# Patient Record
Sex: Male | Born: 1970
Health system: Southern US, Community
[De-identification: ages and names within clinical notes are randomized; demographics above are authoritative.]

## PROBLEM LIST (undated history)

## (undated) DIAGNOSIS — F419 Anxiety disorder, unspecified: Secondary | ICD-10-CM

## (undated) DIAGNOSIS — IMO0001 Reserved for inherently not codable concepts without codable children: Secondary | ICD-10-CM

## (undated) DIAGNOSIS — R7309 Other abnormal glucose: Secondary | ICD-10-CM

## (undated) DIAGNOSIS — D649 Anemia, unspecified: Secondary | ICD-10-CM

## (undated) DIAGNOSIS — I4891 Unspecified atrial fibrillation: Secondary | ICD-10-CM

## (undated) DIAGNOSIS — K219 Gastro-esophageal reflux disease without esophagitis: Secondary | ICD-10-CM

## (undated) DIAGNOSIS — T7840XA Allergy, unspecified, initial encounter: Secondary | ICD-10-CM

## (undated) DIAGNOSIS — I10 Essential (primary) hypertension: Secondary | ICD-10-CM

## (undated) DIAGNOSIS — F439 Reaction to severe stress, unspecified: Secondary | ICD-10-CM

## (undated) DIAGNOSIS — F329 Major depressive disorder, single episode, unspecified: Secondary | ICD-10-CM

## (undated) DIAGNOSIS — J309 Allergic rhinitis, unspecified: Secondary | ICD-10-CM

## (undated) DIAGNOSIS — E785 Hyperlipidemia, unspecified: Secondary | ICD-10-CM

## (undated) DIAGNOSIS — F32A Depression, unspecified: Secondary | ICD-10-CM

## (undated) DIAGNOSIS — M1990 Unspecified osteoarthritis, unspecified site: Secondary | ICD-10-CM

## (undated) HISTORY — DX: Allergy, unspecified, initial encounter: T78.40XA

## (undated) HISTORY — DX: Anemia, unspecified: D64.9

## (undated) HISTORY — PX: ELBOW SURGERY: SHX618

## (undated) HISTORY — DX: Hyperlipidemia, unspecified: E78.5

## (undated) HISTORY — DX: Anxiety disorder, unspecified: F41.9

## (undated) HISTORY — PX: HERNIA REPAIR: SHX51

## (undated) HISTORY — PX: TRIGGER FINGER RELEASE: SHX641

## (undated) HISTORY — DX: Reaction to severe stress, unspecified: F43.9

## (undated) HISTORY — DX: Depression, unspecified: F32.A

## (undated) HISTORY — PX: CARPAL TUNNEL RELEASE: SHX101

## (undated) HISTORY — DX: Unspecified osteoarthritis, unspecified site: M19.90

## (undated) HISTORY — DX: Gastro-esophageal reflux disease without esophagitis: K21.9

## (undated) HISTORY — DX: Allergic rhinitis, unspecified: J30.9

## (undated) HISTORY — DX: Major depressive disorder, single episode, unspecified: F32.9

## (undated) HISTORY — DX: Other abnormal glucose: R73.09

## (undated) HISTORY — DX: Unspecified atrial fibrillation: I48.91

## (undated) HISTORY — DX: Reserved for inherently not codable concepts without codable children: IMO0001

## (undated) HISTORY — DX: Essential (primary) hypertension: I10

---

## 2001-03-20 ENCOUNTER — Encounter: Payer: Self-pay | Admitting: *Deleted

## 2001-03-20 ENCOUNTER — Emergency Department (HOSPITAL_COMMUNITY): Admission: EM | Admit: 2001-03-20 | Discharge: 2001-03-20 | Payer: Self-pay | Admitting: *Deleted

## 2001-05-26 ENCOUNTER — Ambulatory Visit (HOSPITAL_COMMUNITY): Admission: RE | Admit: 2001-05-26 | Discharge: 2001-05-26 | Payer: Self-pay | Admitting: General Surgery

## 2001-10-22 ENCOUNTER — Encounter: Payer: Self-pay | Admitting: Family Medicine

## 2001-10-22 ENCOUNTER — Ambulatory Visit (HOSPITAL_COMMUNITY): Admission: RE | Admit: 2001-10-22 | Discharge: 2001-10-22 | Payer: Self-pay | Admitting: Family Medicine

## 2003-07-21 ENCOUNTER — Other Ambulatory Visit: Admission: RE | Admit: 2003-07-21 | Discharge: 2003-07-21 | Payer: Self-pay | Admitting: Urology

## 2010-03-24 ENCOUNTER — Emergency Department (HOSPITAL_COMMUNITY): Admission: EM | Admit: 2010-03-24 | Discharge: 2010-03-25 | Payer: Self-pay | Admitting: Emergency Medicine

## 2010-12-01 LAB — CBC
HCT: 43 % (ref 39.0–52.0)
Hemoglobin: 14.9 g/dL (ref 13.0–17.0)
MCH: 32 pg (ref 26.0–34.0)
MCHC: 34.7 g/dL (ref 30.0–36.0)
MCV: 92.2 fL (ref 78.0–100.0)
Platelets: 175 10*3/uL (ref 150–400)
RBC: 4.66 MIL/uL (ref 4.22–5.81)
RDW: 14.3 % (ref 11.5–15.5)
WBC: 6 10*3/uL (ref 4.0–10.5)

## 2010-12-01 LAB — BASIC METABOLIC PANEL
BUN: 14 mg/dL (ref 6–23)
CO2: 27 mEq/L (ref 19–32)
Calcium: 9.2 mg/dL (ref 8.4–10.5)
Chloride: 104 mEq/L (ref 96–112)
Creatinine, Ser: 1.26 mg/dL (ref 0.4–1.5)
GFR calc Af Amer: >60 mL/min (ref >60)
GFR calc non Af Amer: >60 mL/min (ref >60)
Glucose, Bld: 113 mg/dL — ABNORMAL HIGH (ref 70–99)
Potassium: 4.2 mEq/L (ref 3.5–5.1)
Sodium: 138 mEq/L (ref 135–145)

## 2010-12-01 LAB — DIFFERENTIAL
Basophils Absolute: 0 10*3/uL (ref 0.0–0.1)
Basophils Relative: 0 % (ref 0–1)
Eosinophils Relative: 3 % (ref 0–5)
Monocytes Absolute: 0.4 10*3/uL (ref 0.1–1.0)

## 2010-12-01 LAB — URINALYSIS, ROUTINE W REFLEX MICROSCOPIC
Bilirubin Urine: NEGATIVE
Ketones, ur: NEGATIVE mg/dL
Nitrite: NEGATIVE
Urobilinogen, UA: 0.2 mg/dL (ref 0.0–1.0)

## 2012-12-02 ENCOUNTER — Other Ambulatory Visit: Payer: Self-pay | Admitting: *Deleted

## 2012-12-02 MED ORDER — OMEPRAZOLE 20 MG PO CPDR: 20.0000 mg | DELAYED_RELEASE_CAPSULE | Freq: Two times a day (BID) | ORAL | Status: DC

## 2012-12-02 MED ORDER — OMEPRAZOLE 20 MG PO CPDR: 20.0000 mg | DELAYED_RELEASE_CAPSULE | Freq: Every day | ORAL | Status: DC

## 2013-01-11 ENCOUNTER — Other Ambulatory Visit: Payer: Self-pay

## 2013-01-11 MED ORDER — DILTIAZEM HCL ER COATED BEADS 240 MG PO CP24: 240.0000 mg | ORAL_CAPSULE | Freq: Every day | ORAL | Status: DC

## 2013-01-12 ENCOUNTER — Encounter: Payer: Self-pay | Admitting: *Deleted

## 2013-03-22 ENCOUNTER — Other Ambulatory Visit: Payer: Self-pay | Admitting: Family Medicine

## 2013-04-22 ENCOUNTER — Other Ambulatory Visit: Payer: Self-pay | Admitting: Family Medicine

## 2013-06-22 ENCOUNTER — Other Ambulatory Visit: Payer: Self-pay | Admitting: Family Medicine

## 2013-07-05 ENCOUNTER — Ambulatory Visit (INDEPENDENT_AMBULATORY_CARE_PROVIDER_SITE_OTHER): Payer: Managed Care, Other (non HMO) | Admitting: Family Medicine

## 2013-07-05 ENCOUNTER — Encounter: Payer: Self-pay | Admitting: Family Medicine

## 2013-07-05 VITALS — BP 158/96 | Ht 68.0 in | Wt 198.0 lb

## 2013-07-05 DIAGNOSIS — M7712 Lateral epicondylitis, left elbow: Secondary | ICD-10-CM

## 2013-07-05 DIAGNOSIS — E781 Pure hyperglyceridemia: Secondary | ICD-10-CM

## 2013-07-05 DIAGNOSIS — K219 Gastro-esophageal reflux disease without esophagitis: Secondary | ICD-10-CM

## 2013-07-05 DIAGNOSIS — M7710 Lateral epicondylitis, unspecified elbow: Secondary | ICD-10-CM

## 2013-07-05 DIAGNOSIS — I10 Essential (primary) hypertension: Secondary | ICD-10-CM

## 2013-07-05 NOTE — Progress Notes (Signed)
  Subjective:    Patient ID: William Roman, male    DOB: 1970-12-16, 42 y.o.   MRN: [id number]  HPI Patient arrives with complaint of left arm pain near the elbow for about a month.pain for about a month. Cutting steel and squatted on elbow. Pain next day, still hurting. Tried local biofreeze, rotating ibuprofen aleave. Wore elbow and forearm strap.   Patient also states he has been having problems with his right little finger locking up on him and he has to forcefully straighten finger back out.  Little finger locks up, going on a couple months, use right hand a lot.  Patient states reflux overall is stable. When taking medicines.  Recently had blood work done. Reports that his triglycerides were high. Tries to watch his diet but perhaps not the best.  Claims compliance with blood pressure medicine. Trying to watch his salt intake. Walking some but not a lot.   Review of Systems No chest pain no headache no back pain no vomiting no change in bowel habits ROS otherwise negative    Objective:   Physical Exam  Alert HEENT normal. Vital stable. Blood pressure improved on repeat 140 rate he 2. Lungs clear. Heart regular in rhythm. Lateral elbow tenderness to palpation. Abdomen benign hands good range of motion no deformities      Assessment & Plan:  Impression 1 hypertension good control. #2 hyperlipidemia discussed particularly high triglycerides. #3 reflux stable. #4 lateral epicondylitis discussed #5 trigger finger discussed plan anti-inflammatory medication when necessary. Maintain other meds. Get the blood work back to [place] for review. Exercise diet discussed in encourage. WSL

## 2013-07-07 ENCOUNTER — Telehealth: Payer: Self-pay | Admitting: Family Medicine

## 2013-07-07 NOTE — Telephone Encounter (Signed)
Patient dropped off Blood Work Results from his Associate Professor.  (These are in a sealed envelope on front of chart placed in Dr. Soyla Dryer In [place] on Beth's desk).  States to call if he needs to repeat any lab draws for our office.

## 2013-07-30 ENCOUNTER — Other Ambulatory Visit: Payer: Self-pay | Admitting: Family Medicine

## 2013-08-11 ENCOUNTER — Other Ambulatory Visit: Payer: Self-pay | Admitting: Family Medicine

## 2013-08-15 DIAGNOSIS — K219 Gastro-esophageal reflux disease without esophagitis: Secondary | ICD-10-CM | POA: Insufficient documentation

## 2013-08-15 DIAGNOSIS — I10 Essential (primary) hypertension: Secondary | ICD-10-CM | POA: Insufficient documentation

## 2013-08-15 DIAGNOSIS — E781 Pure hyperglyceridemia: Secondary | ICD-10-CM | POA: Insufficient documentation

## 2013-09-11 ENCOUNTER — Other Ambulatory Visit: Payer: Self-pay | Admitting: Family Medicine

## 2013-10-28 ENCOUNTER — Other Ambulatory Visit: Payer: Self-pay | Admitting: Family Medicine

## 2013-12-24 ENCOUNTER — Other Ambulatory Visit: Payer: Self-pay | Admitting: Family Medicine

## 2014-01-26 ENCOUNTER — Other Ambulatory Visit: Payer: Self-pay | Admitting: Family Medicine

## 2014-02-23 ENCOUNTER — Other Ambulatory Visit: Payer: Self-pay | Admitting: Family Medicine

## 2014-03-09 ENCOUNTER — Other Ambulatory Visit: Payer: Self-pay | Admitting: Family Medicine

## 2014-03-23 ENCOUNTER — Other Ambulatory Visit: Payer: Self-pay | Admitting: Family Medicine

## 2014-04-12 DIAGNOSIS — Z0289 Encounter for other administrative examinations: Secondary | ICD-10-CM

## 2014-04-18 ENCOUNTER — Ambulatory Visit (INDEPENDENT_AMBULATORY_CARE_PROVIDER_SITE_OTHER): Payer: BC Managed Care – PPO | Admitting: Family Medicine

## 2014-04-18 ENCOUNTER — Encounter: Payer: Self-pay | Admitting: Family Medicine

## 2014-04-18 VITALS — BP 142/98 | Ht 69.0 in | Wt 201.0 lb

## 2014-04-18 DIAGNOSIS — Z79899 Other long term (current) drug therapy: Secondary | ICD-10-CM

## 2014-04-18 DIAGNOSIS — I10 Essential (primary) hypertension: Secondary | ICD-10-CM

## 2014-04-18 DIAGNOSIS — R7301 Impaired fasting glucose: Secondary | ICD-10-CM

## 2014-04-18 DIAGNOSIS — K219 Gastro-esophageal reflux disease without esophagitis: Secondary | ICD-10-CM

## 2014-04-18 DIAGNOSIS — E781 Pure hyperglyceridemia: Secondary | ICD-10-CM

## 2014-04-18 LAB — HEPATIC FUNCTION PANEL
ALBUMIN: 4.7 g/dL (ref 3.5–5.2)
ALK PHOS: 55 U/L (ref 39–117)
ALT: 54 U/L — ABNORMAL HIGH (ref 0–53)
AST: 32 U/L (ref 0–37)
BILIRUBIN DIRECT: 0.1 mg/dL (ref 0.0–0.3)
BILIRUBIN TOTAL: 0.5 mg/dL (ref 0.2–1.2)
Indirect Bilirubin: 0.4 mg/dL (ref 0.2–1.2)
Total Protein: 7.3 g/dL (ref 6.0–8.3)

## 2014-04-18 LAB — BASIC METABOLIC PANEL
BUN: 18 mg/dL (ref 6–23)
CHLORIDE: 104 meq/L (ref 96–112)
CO2: 28 meq/L (ref 19–32)
CREATININE: 1.27 mg/dL (ref 0.50–1.35)
Calcium: 9.5 mg/dL (ref 8.4–10.5)
GLUCOSE: 96 mg/dL (ref 70–99)
Potassium: 4.7 mEq/L (ref 3.5–5.3)
Sodium: 140 mEq/L (ref 135–145)

## 2014-04-18 LAB — LIPID PANEL
Cholesterol: 215 mg/dL — ABNORMAL HIGH (ref 0–200)
HDL: 41 mg/dL (ref >39)
LDL CALC: 142 mg/dL — AB (ref 0–99)
Total CHOL/HDL Ratio: 5.2 Ratio
Triglycerides: 160 mg/dL — ABNORMAL HIGH (ref <150)
VLDL: 32 mg/dL (ref 0–40)

## 2014-04-18 MED ORDER — DILTIAZEM HCL ER COATED BEADS 240 MG PO CP24: ORAL_CAPSULE | ORAL | Status: DC

## 2014-04-18 MED ORDER — OMEPRAZOLE 20 MG PO CPDR: DELAYED_RELEASE_CAPSULE | ORAL | Status: DC

## 2014-04-18 NOTE — Progress Notes (Signed)
   Subjective:    Patient ID: William Roman, male    DOB: 05-27-1971, 43 y.o.   MRN: [id number]  Hypertension This is a chronic problem. The current episode started more than 1 year ago. There are no compliance problems.      Exercise so so  Does not miss bp meds, increased stress last few days and not surprising numbers were up  Numbers generally good when ck'ed wlsewhere  Reflux generally stable, compliant with medications. When he stops acid blocker it returns quickly.  History of hyperlipidemia. Watching his diet better. But not completely. Not exercising.  History of glucose intolerance. Has cut down sugars in the diet.   Diet overall better thsn before  Has cut down fried foods, No bw for two yrs,    Review of Systems No chest pain no headache no back pain no abdominal pain no change in bowel habits no blood in stool ROS otherwise negative    Objective:   Physical Exam  Alert no apparent distress. Lungs clear. Heart rare rhythm H&T normal neck supple. Ankles without edema. Abdomen no tenderness no rebound no guarding no masses     Assessment & Plan:  Pressure 1 hypertension good control. #2 reflux discussed good control. #3 hyperlipidemia status uncertain. #4 impaired fasting glucose status uncertain plan meds refilled. Diet discussed exercise discussed. Appropriate blood work. Further recommendations based results. WSL

## 2014-05-01 NOTE — Progress Notes (Signed)
Patient notified and verbalized understanding of test results. No further questions.  

## 2014-06-07 LAB — PULMONARY FUNCTION TEST

## 2014-06-12 ENCOUNTER — Other Ambulatory Visit: Payer: Self-pay | Admitting: Family Medicine

## 2014-08-25 ENCOUNTER — Encounter: Payer: Self-pay | Admitting: Family Medicine

## 2014-08-25 ENCOUNTER — Ambulatory Visit (INDEPENDENT_AMBULATORY_CARE_PROVIDER_SITE_OTHER): Payer: BC Managed Care – PPO | Admitting: Family Medicine

## 2014-08-25 VITALS — BP 124/90 | Temp 98.1°F | Ht 69.0 in | Wt 201.1 lb

## 2014-08-25 DIAGNOSIS — J019 Acute sinusitis, unspecified: Secondary | ICD-10-CM

## 2014-08-25 DIAGNOSIS — B9689 Other specified bacterial agents as the cause of diseases classified elsewhere: Secondary | ICD-10-CM

## 2014-08-25 MED ORDER — AMOXICILLIN-POT CLAVULANATE 875-125 MG PO TABS: 1.0000 | ORAL_TABLET | Freq: Two times a day (BID) | ORAL | Status: DC

## 2014-08-25 NOTE — Patient Instructions (Signed)

## 2014-08-25 NOTE — Progress Notes (Signed)
   Subjective:    Patient ID: William Roman, male    DOB: 1971-05-25, 43 y.o.   MRN: [id number]  Sinusitis This is a new problem. The current episode started 1 to 4 weeks ago. The problem is unchanged. There has been no fever. The pain is moderate. Associated symptoms include congestion, coughing and sinus pressure. Past treatments include oral decongestants and acetaminophen (cough medication). The treatment provided moderate relief.   Patient states that he has no other concerns at this time.   Start off with cold started get better after week they've it got a lot worse with sinus pressure pain discomfort no wheezing or difficulty breathing no high fevers Review of Systems  HENT: Positive for congestion and sinus pressure.   Respiratory: Positive for cough.        Objective:   Physical Exam  Moderate sinus tenderness throat normal neck supple lungs clear heart regular      Assessment & Plan:  Viral syndrome Secondary sinusitis Antibiotics prescribed Follow-up of ongoing troubles

## 2014-08-28 ENCOUNTER — Telehealth: Payer: Self-pay | Admitting: Family Medicine

## 2014-08-28 MED ORDER — CEFDINIR 300 MG PO CAPS: 300.0000 mg | ORAL_CAPSULE | Freq: Two times a day (BID) | ORAL | Status: DC

## 2014-08-28 NOTE — Telephone Encounter (Signed)
Was given Augmentin

## 2014-08-28 NOTE — Telephone Encounter (Signed)
Medication sent to pharmacy. Patient was notified.  

## 2014-08-28 NOTE — Telephone Encounter (Signed)
Patient is still stopped up and having pressure.  He still has yellowish green mucous and doesn't feel like the amoxicillan is helping at all. Can we change his antibiotic?   [facility]

## 2014-08-28 NOTE — Telephone Encounter (Signed)
omnicef 300 bid ten d 

## 2014-10-04 ENCOUNTER — Other Ambulatory Visit: Payer: Self-pay | Admitting: Family Medicine

## 2014-12-28 ENCOUNTER — Other Ambulatory Visit: Payer: Self-pay | Admitting: Family Medicine

## 2015-02-02 ENCOUNTER — Other Ambulatory Visit: Payer: Self-pay | Admitting: Family Medicine

## 2015-02-02 ENCOUNTER — Other Ambulatory Visit: Payer: Self-pay | Admitting: *Deleted

## 2015-02-02 MED ORDER — DILTIAZEM HCL ER COATED BEADS 240 MG PO CP24: 240.0000 mg | ORAL_CAPSULE | Freq: Every day | ORAL | Status: DC

## 2015-02-26 ENCOUNTER — Ambulatory Visit: Payer: Self-pay | Admitting: Family Medicine

## 2015-03-01 ENCOUNTER — Ambulatory Visit (INDEPENDENT_AMBULATORY_CARE_PROVIDER_SITE_OTHER): Payer: BLUE CROSS/BLUE SHIELD | Admitting: Family Medicine

## 2015-03-01 ENCOUNTER — Encounter: Payer: Self-pay | Admitting: Family Medicine

## 2015-03-01 VITALS — BP 138/88 | Ht 69.0 in | Wt 197.4 lb

## 2015-03-01 DIAGNOSIS — E785 Hyperlipidemia, unspecified: Secondary | ICD-10-CM

## 2015-03-01 DIAGNOSIS — R7301 Impaired fasting glucose: Secondary | ICD-10-CM | POA: Diagnosis not present

## 2015-03-01 DIAGNOSIS — I10 Essential (primary) hypertension: Secondary | ICD-10-CM | POA: Diagnosis not present

## 2015-03-01 DIAGNOSIS — R0600 Dyspnea, unspecified: Secondary | ICD-10-CM | POA: Diagnosis not present

## 2015-03-01 DIAGNOSIS — Z79899 Other long term (current) drug therapy: Secondary | ICD-10-CM

## 2015-03-01 MED ORDER — OMEPRAZOLE 20 MG PO CPDR: 20.0000 mg | DELAYED_RELEASE_CAPSULE | Freq: Every day | ORAL | Status: DC

## 2015-03-01 MED ORDER — DILTIAZEM HCL ER COATED BEADS 240 MG PO CP24: 240.0000 mg | ORAL_CAPSULE | Freq: Every day | ORAL | Status: DC

## 2015-03-01 NOTE — Progress Notes (Signed)
   Subjective:    Patient ID: [name], male    DOB: October 07, 1970, 44 y.o.   MRN: [id number]  Hypertension This is a chronic problem. The current episode started more than 1 year ago. The problem has been gradually improving since onset. Treatments tried: [place]. The current treatment provides moderate improvement. There are no compliance problems.    Patient states that he needs refills Cartia. Patient would like to discuss lab results from job. Patients states no other concerns this visit.  Out of b p meds for a week   Patient had blood work which showed some mild changes with lipids and liver function. Unfortunately these were done 8 or 9 months ago.  Also has been told he has declining pulmonary function. He receives PFTs through his workplace. This has shown a steady decline over time with FEV1. Particularly. No history of asthma.  No major smoke exposure no major fume exposure. Next  Patient notes progressive dyspnea    Review of Systems No headache no chest pain no chronic cough no hemoptysis no abdominal pain    Objective:   Physical Exam Alert vitals stable blood pressure elevated 144/90. HEENT normal. Lungs clear. Heart regular in rhythm. Abdomen benign.       Assessment & Plan:  Impression 1 progressive dyspnea with shortness of breath with exertion. Also steady decline on PFTs discuss will workup further #2 hyperlipidemia status uncertain #3 hypertension #4 history of elevated liver enzymes plan appropriate blood work. X-ray. Pulmonary function tests further recommendations based results WSL

## 2015-03-02 LAB — LIPID PANEL
CHOL/HDL RATIO: 5.8 ratio — AB (ref 0.0–5.0)
Cholesterol, Total: 216 mg/dL — ABNORMAL HIGH (ref 100–199)
HDL: 37 mg/dL — AB (ref >39)
LDL Calculated: 141 mg/dL — ABNORMAL HIGH (ref 0–99)
TRIGLYCERIDES: 188 mg/dL — AB (ref 0–149)
VLDL CHOLESTEROL CAL: 38 mg/dL (ref 5–40)

## 2015-03-02 LAB — BASIC METABOLIC PANEL
BUN/Creatinine Ratio: 9 (ref 9–20)
BUN: 12 mg/dL (ref 6–24)
CHLORIDE: 103 mmol/L (ref 97–108)
CO2: 24 mmol/L (ref 18–29)
Calcium: 9.9 mg/dL (ref 8.7–10.2)
Creatinine, Ser: 1.31 mg/dL — ABNORMAL HIGH (ref 0.76–1.27)
GFR calc non Af Amer: 67 mL/min/{1.73_m2} (ref >59)
GFR, EST AFRICAN AMERICAN: 78 mL/min/{1.73_m2} (ref >59)
GLUCOSE: 101 mg/dL — AB (ref 65–99)
Potassium: 5.6 mmol/L — ABNORMAL HIGH (ref 3.5–5.2)
Sodium: 144 mmol/L (ref 134–144)

## 2015-03-02 LAB — HEPATIC FUNCTION PANEL
ALBUMIN: 5 g/dL (ref 3.5–5.5)
ALT: 44 IU/L (ref 0–44)
AST: 28 IU/L (ref 0–40)
Alkaline Phosphatase: 76 IU/L (ref 39–117)
BILIRUBIN TOTAL: 0.5 mg/dL (ref 0.0–1.2)
Bilirubin, Direct: 0.12 mg/dL (ref 0.00–0.40)
TOTAL PROTEIN: 7.5 g/dL (ref 6.0–8.5)

## 2015-03-06 ENCOUNTER — Ambulatory Visit (HOSPITAL_COMMUNITY)
Admission: RE | Admit: 2015-03-06 | Discharge: 2015-03-06 | Disposition: A | Discharge status: Home / Self Care | Payer: BLUE CROSS/BLUE SHIELD | Source: Ambulatory Visit | Attending: Family Medicine | Admitting: Family Medicine

## 2015-03-06 DIAGNOSIS — R0602 Shortness of breath: Secondary | ICD-10-CM | POA: Insufficient documentation

## 2015-03-14 ENCOUNTER — Ambulatory Visit (HOSPITAL_COMMUNITY)
Admission: RE | Admit: 2015-03-14 | Discharge: 2015-03-14 | Disposition: A | Discharge status: Home / Self Care | Payer: BLUE CROSS/BLUE SHIELD | Source: Ambulatory Visit | Attending: Family Medicine | Admitting: Family Medicine

## 2015-03-14 DIAGNOSIS — R0600 Dyspnea, unspecified: Secondary | ICD-10-CM | POA: Diagnosis present

## 2015-03-14 LAB — PULMONARY FUNCTION TEST
DL/VA % pred: 126 %
DL/VA: 5.76 ml/min/mmHg/L
DLCO unc % pred: 106 %
DLCO unc: 31.56 ml/min/mmHg
FEF 25-75 POST: 4.78 L/s
FEF 25-75 PRE: 4.09 L/s
FEF2575-%Change-Post: 16 %
FEF2575-%PRED-POST: 128 %
FEF2575-%PRED-PRE: 110 %
FEV1-%CHANGE-POST: 3 %
FEV1-%Pred-Post: 101 %
FEV1-%Pred-Pre: 98 %
FEV1-PRE: 3.85 L
FEV1-Post: 3.99 L
FEV1FVC-%Change-Post: 4 %
FEV1FVC-%PRED-PRE: 104 %
FEV6-%Change-Post: -1 %
FEV6-%PRED-POST: 95 %
FEV6-%Pred-Pre: 96 %
FEV6-POST: 4.6 L
FEV6-PRE: 4.65 L
FEV6FVC-%PRED-PRE: 103 %
FEV6FVC-%Pred-Post: 103 %
FVC-%Change-Post: -1 %
FVC-%PRED-PRE: 94 %
FVC-%Pred-Post: 93 %
FVC-POST: 4.6 L
FVC-Pre: 4.65 L
POST FEV6/FVC RATIO: 100 %
Post FEV1/FVC ratio: 87 %
Pre FEV1/FVC ratio: 83 %
Pre FEV6/FVC Ratio: 100 %
RV % PRED: 65 %
RV: 1.15 L
TLC % pred: 87 %
TLC: 5.68 L

## 2015-03-14 MED ORDER — ALBUTEROL SULFATE (2.5 MG/3ML) 0.083% IN NEBU
2.5000 mg | INHALATION_SOLUTION | Freq: Once | RESPIRATORY_TRACT | Status: AC
  Administered 2015-03-14: 2.5 mg via RESPIRATORY_TRACT

## 2015-07-16 ENCOUNTER — Encounter: Payer: Self-pay | Admitting: Family Medicine

## 2015-07-16 ENCOUNTER — Ambulatory Visit (HOSPITAL_COMMUNITY)
Admission: RE | Admit: 2015-07-16 | Discharge: 2015-07-16 | Disposition: A | Discharge status: Home / Self Care | Payer: BLUE CROSS/BLUE SHIELD | Source: Ambulatory Visit | Attending: Family Medicine | Admitting: Family Medicine

## 2015-07-16 ENCOUNTER — Ambulatory Visit (INDEPENDENT_AMBULATORY_CARE_PROVIDER_SITE_OTHER): Payer: BLUE CROSS/BLUE SHIELD | Admitting: Family Medicine

## 2015-07-16 VITALS — BP 138/90 | Ht 69.0 in | Wt 193.8 lb

## 2015-07-16 DIAGNOSIS — M79641 Pain in right hand: Secondary | ICD-10-CM | POA: Insufficient documentation

## 2015-07-16 MED ORDER — NABUMETONE 750 MG PO TABS: 750.0000 mg | ORAL_TABLET | Freq: Two times a day (BID) | ORAL | Status: DC

## 2015-07-16 NOTE — Progress Notes (Signed)
   Subjective:    Patient ID: William Roman, male    DOB: 1971-01-29, 44 y.o.   MRN: [id number]  HPI Patient arrives with c/o right hand pain and swelling for 3 weeks- worse last week.  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 Does use his right hand a lot. 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  Developed gradual pain. Sharp in nature comes and goes worse while working now more swelling last few days recalls no sudden injury   Review of Systems Some wrist pain no elbow pain no joint pain elsewhere    Objective:   Physical Exam  Alert final stable. Lungs clear heart regular in rhythm right dorsum hand impressive swelling positive pain with deep pressure. No point tenderness risk good range of motion      Assessment & Plan:  Impression probable T no synovitis dorsal hand from overuse plan x-ray. Relafen twice a day with food expect slow resolution try to diminish use of right hand WSL

## 2015-08-08 ENCOUNTER — Telehealth: Payer: Self-pay | Admitting: Family Medicine

## 2015-08-08 MED ORDER — PREDNISONE 20 MG PO TABS: ORAL_TABLET | ORAL | Status: DC

## 2015-08-08 NOTE — Telephone Encounter (Signed)
Patient is still having a lot of pain in his wrist, but the inflammation is gone.  He was seen for this on 07/16/2015.  He is still not able to do a lot of daily activities and he wants to know if he should be seen again or if we can send in Rx for steroid?    [facility]

## 2015-08-08 NOTE — Telephone Encounter (Signed)
Left message on voicemail notifying patient med was sent to pharmacy.

## 2015-08-08 NOTE — Telephone Encounter (Signed)
Adult pred taper 

## 2015-08-22 ENCOUNTER — Other Ambulatory Visit: Payer: Self-pay | Admitting: Family Medicine

## 2015-09-15 ENCOUNTER — Other Ambulatory Visit: Payer: Self-pay | Admitting: Family Medicine

## 2015-09-27 ENCOUNTER — Other Ambulatory Visit: Payer: Self-pay | Admitting: Family Medicine

## 2015-10-08 ENCOUNTER — Ambulatory Visit (INDEPENDENT_AMBULATORY_CARE_PROVIDER_SITE_OTHER): Payer: BLUE CROSS/BLUE SHIELD | Admitting: Family Medicine

## 2015-10-08 ENCOUNTER — Encounter: Payer: Self-pay | Admitting: Family Medicine

## 2015-10-08 VITALS — BP 138/86 | Ht 69.0 in | Wt 197.5 lb

## 2015-10-08 DIAGNOSIS — M79641 Pain in right hand: Secondary | ICD-10-CM

## 2015-10-08 DIAGNOSIS — K219 Gastro-esophageal reflux disease without esophagitis: Secondary | ICD-10-CM

## 2015-10-08 DIAGNOSIS — I10 Essential (primary) hypertension: Secondary | ICD-10-CM | POA: Diagnosis not present

## 2015-10-08 DIAGNOSIS — E785 Hyperlipidemia, unspecified: Secondary | ICD-10-CM | POA: Diagnosis not present

## 2015-10-08 MED ORDER — DILTIAZEM HCL ER COATED BEADS 240 MG PO CP24: 240.0000 mg | ORAL_CAPSULE | Freq: Every day | ORAL | Status: DC

## 2015-10-08 MED ORDER — NABUMETONE 750 MG PO TABS: 750.0000 mg | ORAL_TABLET | Freq: Two times a day (BID) | ORAL | Status: DC

## 2015-10-08 MED ORDER — OMEPRAZOLE 20 MG PO CPDR: 20.0000 mg | DELAYED_RELEASE_CAPSULE | Freq: Every day | ORAL | Status: DC

## 2015-10-08 NOTE — Progress Notes (Signed)
   Subjective:    Patient ID: William Roman, male    DOB: 03-15-1971, 45 y.o.   MRN: [id number]  Hypertension This is a chronic problem. The current episode started more than 1 year ago. The problem has been gradually improving since onset. The problem is controlled. There are no associated agents to hypertension. There are no known risk factors for coronary artery disease. Treatments tried: [place]. The current treatment provides moderate improvement. There are no compliance problems.    Patient needs a refill on nabumetone. States definitely helps his wrist. Please see prior notes. Notes more swelling. Unfortunately working 60 hours week. 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 Meds reviewed today. Completely compliant. Without develops tremendous side effects  Takes qhs night itme meds of blood pressure and omeprazole  Review of Systems No headache no chest pain no abdominal pain no change in bowel habits no blood in stool    Objective:   Physical Exam Alert vitals stable. Lungs clear. Heart rare rhythm HET normal abdomen benign right dorsal wrist tenderness with flexion and extension ankles no edema       Assessment & Plan:  Impression 1 hypertension good control meds reviewed to maintain same #2 right hand wrist tendinitis discussed patient maintain anti-inflammatory medicine use and work on diminishing ergonomic load #3 reflux ongoing in nature with need for this level meds discussed and clarified plan all meds refilled. Diet exercise discussed. Blood work discussed. WSL

## 2015-10-24 ENCOUNTER — Encounter: Payer: Self-pay | Admitting: Family Medicine

## 2015-10-24 ENCOUNTER — Ambulatory Visit (INDEPENDENT_AMBULATORY_CARE_PROVIDER_SITE_OTHER): Payer: BLUE CROSS/BLUE SHIELD | Admitting: Family Medicine

## 2015-10-24 VITALS — BP 128/82 | Temp 98.5°F | Ht 69.0 in | Wt 192.6 lb

## 2015-10-24 DIAGNOSIS — J111 Influenza due to unidentified influenza virus with other respiratory manifestations: Secondary | ICD-10-CM

## 2015-10-24 NOTE — Progress Notes (Signed)
   Subjective:    Patient ID: William Roman, male    DOB: [birth year], 45 y.o.   MRN: [id number]  Cough This is a new problem. The current episode started in the past 7 days. Associated symptoms include a fever, headaches, myalgias, nasal congestion and a sore throat. Treatments tried: otc meds.   Four to five days ago started with loose stools  Sig cough started a few d ago  Got ral achey and felt bad  Ibu takes 800 mg felt very bad still  Deep bronchial cough  cheills  difuse headache back of head and behind the eyes   Energy level ok, appetite decent    Review of Systems  Constitutional: Positive for fever.  HENT: Positive for sore throat.   Respiratory: Positive for cough.   Musculoskeletal: Positive for myalgias.  Neurological: Positive for headaches.       Objective:   Physical Exam Alert mild malaise. Vitals stable. HEENT slight nasal congestion frank normal neck supple lungs clear. Heart regular in rhythm.       Assessment & Plan:  Alert mild malaise. Impression 1 probable influenza discussed plan symptom care only at this point warning signs discussed patient to call back if secondary symptoms emerge WSL

## 2015-10-30 ENCOUNTER — Telehealth: Payer: Self-pay | Admitting: Family Medicine

## 2015-10-30 MED ORDER — AMOXICILLIN-POT CLAVULANATE 875-125 MG PO TABS: ORAL_TABLET | ORAL | Status: DC

## 2015-10-30 NOTE — Telephone Encounter (Signed)
Pt called stating that his mucous has changed from clear to greenish yellow with a tint of blood. Pt was told if there was a change that the Dr. Lynnda Child call in an antibiotic for him.     [facility]

## 2015-10-30 NOTE — Telephone Encounter (Signed)
Aug bid ten d 875 mg

## 2015-10-30 NOTE — Telephone Encounter (Signed)
Spoke with patient and informed him per Dr.Steve Luking- Augmentin 875 1 tablet twice a day for 10 days was sent to [facility]. Patient verbalized understanding.

## 2015-10-30 NOTE — Telephone Encounter (Signed)
[facility] 10/30/15

## 2016-01-25 ENCOUNTER — Other Ambulatory Visit: Payer: Self-pay | Admitting: Family Medicine

## 2016-03-10 ENCOUNTER — Telehealth: Payer: Self-pay | Admitting: Family Medicine

## 2016-03-10 DIAGNOSIS — I10 Essential (primary) hypertension: Secondary | ICD-10-CM

## 2016-03-10 DIAGNOSIS — E781 Pure hyperglyceridemia: Secondary | ICD-10-CM

## 2016-03-10 NOTE — Telephone Encounter (Signed)
Pt needs bw orders for appt july 5th   Last labs  02/29/16 BMP, Hep, Lip, CBC

## 2016-03-11 NOTE — Telephone Encounter (Signed)
Left message return call (blood work ordered)

## 2016-03-11 NOTE — Telephone Encounter (Signed)
Lip liv m7 

## 2016-03-11 NOTE — Telephone Encounter (Signed)
Pt.notified

## 2016-03-16 LAB — HEPATIC FUNCTION PANEL
ALBUMIN: 4.7 g/dL (ref 3.5–5.5)
ALT: 31 IU/L (ref 0–44)
AST: 22 IU/L (ref 0–40)
Alkaline Phosphatase: 74 IU/L (ref 39–117)
Bilirubin Total: 0.8 mg/dL (ref 0.0–1.2)
Bilirubin, Direct: 0.17 mg/dL (ref 0.00–0.40)
Total Protein: 7 g/dL (ref 6.0–8.5)

## 2016-03-16 LAB — BASIC METABOLIC PANEL
BUN/Creatinine Ratio: 10 (ref 9–20)
BUN: 13 mg/dL (ref 6–24)
CALCIUM: 9.5 mg/dL (ref 8.7–10.2)
CHLORIDE: 102 mmol/L (ref 96–106)
CO2: 23 mmol/L (ref 18–29)
Creatinine, Ser: 1.29 mg/dL — ABNORMAL HIGH (ref 0.76–1.27)
GFR calc Af Amer: 79 mL/min/{1.73_m2} (ref >59)
GFR calc non Af Amer: 68 mL/min/{1.73_m2} (ref >59)
GLUCOSE: 100 mg/dL — AB (ref 65–99)
POTASSIUM: 4 mmol/L (ref 3.5–5.2)
Sodium: 142 mmol/L (ref 134–144)

## 2016-03-16 LAB — LIPID PANEL
CHOLESTEROL TOTAL: 207 mg/dL — AB (ref 100–199)
Chol/HDL Ratio: 4.6 ratio units (ref 0.0–5.0)
HDL: 45 mg/dL (ref >39)
LDL CALC: 133 mg/dL — AB (ref 0–99)
Triglycerides: 146 mg/dL (ref 0–149)
VLDL CHOLESTEROL CAL: 29 mg/dL (ref 5–40)

## 2016-03-19 ENCOUNTER — Ambulatory Visit (INDEPENDENT_AMBULATORY_CARE_PROVIDER_SITE_OTHER): Payer: BLUE CROSS/BLUE SHIELD | Admitting: Family Medicine

## 2016-03-19 ENCOUNTER — Encounter: Payer: Self-pay | Admitting: Family Medicine

## 2016-03-19 VITALS — BP 144/90 | Ht 69.0 in | Wt 181.4 lb

## 2016-03-19 DIAGNOSIS — I10 Essential (primary) hypertension: Secondary | ICD-10-CM | POA: Diagnosis not present

## 2016-03-19 DIAGNOSIS — N521 Erectile dysfunction due to diseases classified elsewhere: Secondary | ICD-10-CM | POA: Diagnosis not present

## 2016-03-19 DIAGNOSIS — M79641 Pain in right hand: Secondary | ICD-10-CM | POA: Diagnosis not present

## 2016-03-19 DIAGNOSIS — Z139 Encounter for screening, unspecified: Secondary | ICD-10-CM

## 2016-03-19 DIAGNOSIS — E785 Hyperlipidemia, unspecified: Secondary | ICD-10-CM | POA: Diagnosis not present

## 2016-03-19 DIAGNOSIS — R5383 Other fatigue: Secondary | ICD-10-CM

## 2016-03-19 MED ORDER — DILTIAZEM HCL ER COATED BEADS 240 MG PO CP24: 240.0000 mg | ORAL_CAPSULE | Freq: Every day | ORAL | Status: DC

## 2016-03-19 NOTE — Progress Notes (Signed)
   Subjective:    Patient ID: William Roman, male    DOB: [birth year], 45 y.o.   MRN: [id number]  patient arrives office with numerous concerns. Hypertension This is a chronic problem. The current episode started more than 1 year ago. The problem has been gradually improving since onset. There are no associated agents to hypertension. There are no known risk factors for coronary artery disease. Treatments tried: diltazem. The current treatment provides moderate improvement. There are no compliance problems.    Patient is still having right wrist pain that has been present for 6-7 months now.still having sig problems. cponstant pain and hjrtin gmore. Not taking a lot of ibuprofen.  Affects ability to do certain things withworth, cutting  Belts with cutters eetc.  Patient wants to discuss his lab work results  Patient is having problems with ED also. challengw with ere ctions fopver the last two years Pogressive i naure. Hisory of low testosterone.   Known history of hyperlipidema. Moty eentring to watch det  Results for orders placed or performed in visit on 03/10/16  Lipid panel  Result Value Ref Range   Cholesterol, Total 207 (H) 100 - 199 mg/dL   Triglycerides 146 0 - 149 mg/dL   HDL 45 >39 mg/dL   VLDL Cholesterol Cal 29 5 - 40 mg/dL   LDL Calculated 133 (H) 0 - 99 mg/dL   Chol/HDL Ratio 4.6 0.0 - 5.0 ratio units  Hepatic function panel  Result Value Ref Range   Total Protein 7.0 6.0 - 8.5 g/dL   Albumin 4.7 3.5 - 5.5 g/dL   Bilirubin Total 0.8 0.0 - 1.2 mg/dL   Bilirubin, Direct 0.17 0.00 - 0.40 mg/dL   Alkaline Phosphatase 74 39 - 117 IU/L   AST 22 0 - 40 IU/L   ALT 31 0 - 44 IU/L  Basic metabolic panel  Result Value Ref Range   Glucose 100 (H) 65 - 99 mg/dL   BUN 13 6 - 24 mg/dL   Creatinine, Ser 1.29 (H) 0.76 - 1.27 mg/dL   GFR calc non Af Amer 68 >59 mL/min/1.73   GFR calc Af Amer 79 >59 mL/min/1.73   BUN/Creatinine Ratio 10 9 - 20   Sodium 142 134 - 144 mmol/L   Potassium 4.0 3.5 - 5.2 mmol/L   Chloride 102 96 - 106 mmol/L   CO2 23 18 - 29 mmol/L   Calcium 9.5 8.7 - 10.2 mg/dL     Review of Systems No headache, no major weight loss or weight gain, no chest pain no back pain abdominal pain no change in bowel habits complete ROS otherwise negative     Objective:   Physical Exam    alert vitl stbleblood presure good nrepe HEENT normallungs cear heart rare rhythm right dorsal wrist handsome tenderness to deep palpation no obviosdeormity n lligcurrntly     Assessment & Plan:  Impression chronic wrist pain time fr orthopedic referraldiscusd 3 hypertenion good corol dicussd 33 herlipiedecent cntrol discussed #4 erectile dsfuntion worsening pla continue same boodpressur medication. ,  otopedic eeral discussed and recommended further blood work to [employer] [facility]. Follow-u with phyical. dietexercise dscussedWSL

## 2016-04-17 ENCOUNTER — Ambulatory Visit (INDEPENDENT_AMBULATORY_CARE_PROVIDER_SITE_OTHER): Payer: BLUE CROSS/BLUE SHIELD | Admitting: Family Medicine

## 2016-04-17 ENCOUNTER — Encounter: Payer: Self-pay | Admitting: Family Medicine

## 2016-04-17 VITALS — BP 158/104 | Ht 67.5 in | Wt 182.0 lb

## 2016-04-17 DIAGNOSIS — A609 Anogenital herpesviral infection, unspecified: Secondary | ICD-10-CM | POA: Diagnosis not present

## 2016-04-17 DIAGNOSIS — I10 Essential (primary) hypertension: Secondary | ICD-10-CM

## 2016-04-17 DIAGNOSIS — A6000 Herpesviral infection of urogenital system, unspecified: Secondary | ICD-10-CM

## 2016-04-17 DIAGNOSIS — Z0000 Encounter for general adult medical examination without abnormal findings: Secondary | ICD-10-CM

## 2016-04-17 LAB — HSV(HERPES SIMPLEX VRS) I + II AB-IGM: HSVI/II COMB AB IGM: 1.87 ratio — AB (ref 0.00–0.90)

## 2016-04-17 LAB — HSV(HERPES SIMPLEX VRS) I + II AB-IGG
HSV 1 Glycoprotein G Ab, IgG: <0.91 index (ref 0.00–0.90)
HSV 2 Glycoprotein G Ab, IgG: 11.3 index — ABNORMAL HIGH (ref 0.00–0.90)

## 2016-04-17 LAB — TESTOSTERONE: TESTOSTERONE: 477 ng/dL (ref 264–916)

## 2016-04-17 MED ORDER — ACYCLOVIR 400 MG PO TABS: 400.0000 mg | ORAL_TABLET | Freq: Two times a day (BID) | ORAL | 5 refills | Status: DC

## 2016-04-17 MED ORDER — DILTIAZEM HCL ER COATED BEADS 360 MG PO CP24: 360.0000 mg | ORAL_CAPSULE | Freq: Every day | ORAL | 5 refills | Status: DC
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
## 2016-05-15 ENCOUNTER — Other Ambulatory Visit: Payer: Self-pay | Admitting: Family Medicine

## 2016-10-13 ENCOUNTER — Other Ambulatory Visit: Payer: Self-pay | Admitting: Family Medicine

## 2016-12-13 ENCOUNTER — Other Ambulatory Visit: Payer: Self-pay | Admitting: Family Medicine

## 2016-12-15 NOTE — Telephone Encounter (Signed)
Last seen 04/17/16

## 2017-02-02 ENCOUNTER — Emergency Department (HOSPITAL_COMMUNITY): Payer: BLUE CROSS/BLUE SHIELD

## 2017-02-02 ENCOUNTER — Encounter (HOSPITAL_COMMUNITY): Payer: Self-pay

## 2017-02-02 ENCOUNTER — Emergency Department (HOSPITAL_COMMUNITY)
Admission: EM | Admit: 2017-02-02 | Discharge: 2017-02-02 | Disposition: A | Discharge status: Home / Self Care | Payer: BLUE CROSS/BLUE SHIELD | Attending: Emergency Medicine | Admitting: Emergency Medicine

## 2017-02-02 DIAGNOSIS — Y92410 Unspecified street and highway as the place of occurrence of the external cause: Secondary | ICD-10-CM | POA: Diagnosis not present

## 2017-02-02 DIAGNOSIS — Y939 Activity, unspecified: Secondary | ICD-10-CM | POA: Diagnosis not present

## 2017-02-02 DIAGNOSIS — I10 Essential (primary) hypertension: Secondary | ICD-10-CM | POA: Insufficient documentation

## 2017-02-02 DIAGNOSIS — Z79899 Other long term (current) drug therapy: Secondary | ICD-10-CM | POA: Insufficient documentation

## 2017-02-02 DIAGNOSIS — S199XXA Unspecified injury of neck, initial encounter: Secondary | ICD-10-CM | POA: Diagnosis present

## 2017-02-02 DIAGNOSIS — S161XXA Strain of muscle, fascia and tendon at neck level, initial encounter: Secondary | ICD-10-CM | POA: Insufficient documentation

## 2017-02-02 DIAGNOSIS — Y999 Unspecified external cause status: Secondary | ICD-10-CM | POA: Diagnosis not present

## 2017-02-02 MED ORDER — ACETAMINOPHEN 325 MG PO TABS
650.0000 mg | ORAL_TABLET | Freq: Once | ORAL | Status: AC
  Administered 2017-02-02: 650 mg via ORAL
  Filled 2017-02-02: qty 2

## 2017-02-02 MED ORDER — CYCLOBENZAPRINE HCL 5 MG PO TABS: 5.0000 mg | ORAL_TABLET | Freq: Three times a day (TID) | ORAL | 0 refills | Status: DC | PRN

## 2017-02-02 MED ORDER — IBUPROFEN 600 MG PO TABS: 600.0000 mg | ORAL_TABLET | Freq: Four times a day (QID) | ORAL | 0 refills | Status: DC | PRN

## 2017-02-02 NOTE — ED Notes (Signed)
Applied c-colar to patient in triage.
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
## 2017-02-22 ENCOUNTER — Other Ambulatory Visit: Payer: Self-pay | Admitting: Family Medicine

## 2017-02-25 ENCOUNTER — Encounter: Payer: Self-pay | Admitting: Family Medicine

## 2017-02-25 ENCOUNTER — Ambulatory Visit (INDEPENDENT_AMBULATORY_CARE_PROVIDER_SITE_OTHER): Payer: BLUE CROSS/BLUE SHIELD | Admitting: Family Medicine

## 2017-02-25 VITALS — BP 160/108 | Ht 67.5 in | Wt 183.0 lb

## 2017-02-25 DIAGNOSIS — I10 Essential (primary) hypertension: Secondary | ICD-10-CM | POA: Diagnosis not present

## 2017-02-25 DIAGNOSIS — Z79899 Other long term (current) drug therapy: Secondary | ICD-10-CM | POA: Diagnosis not present

## 2017-02-25 DIAGNOSIS — K219 Gastro-esophageal reflux disease without esophagitis: Secondary | ICD-10-CM | POA: Diagnosis not present

## 2017-02-25 DIAGNOSIS — Z113 Encounter for screening for infections with a predominantly sexual mode of transmission: Secondary | ICD-10-CM

## 2017-02-25 DIAGNOSIS — E781 Pure hyperglyceridemia: Secondary | ICD-10-CM | POA: Diagnosis not present

## 2017-02-25 MED ORDER — ACYCLOVIR 400 MG PO TABS: ORAL_TABLET | ORAL | 11 refills | Status: DC

## 2017-02-25 MED ORDER — OMEPRAZOLE 20 MG PO CPDR: 20.0000 mg | DELAYED_RELEASE_CAPSULE | Freq: Every day | ORAL | 11 refills | Status: DC

## 2017-02-25 MED ORDER — DILTIAZEM HCL ER COATED BEADS 240 MG PO CP24: 240.0000 mg | ORAL_CAPSULE | Freq: Every day | ORAL | 5 refills | Status: DC

## 2017-02-25 MED ORDER — VALSARTAN 160 MG PO TABS: 160.0000 mg | ORAL_TABLET | Freq: Every day | ORAL | 5 refills | Status: DC

## 2017-02-25 NOTE — Progress Notes (Signed)
   Subjective:    Patient ID: William Roman, male    DOB: [birth year], 46 y.o.   MRN: [id number]  Hypertension  This is a chronic problem. Treatments tried: diltiazem. There are no compliance problems (takes meds every day, exercises, eats healthy).    Blood pressure medicine and blood pressure levels reviewed today with patient. Compliant with blood pressure medicine. States does not miss a dose. No obvious side effects. Blood pressure generally good when checked elsewhere. Watching salt intake.  Has cut back salt and sugars  And such    Exercise overll pretty good   Pt is fasting today. Wants to get bloodwork orders and screen for std. Would like to have standard blood screening but also would like to have blood work to look it potential for HIV and a's.  States reflux in good control. Compliant with medication. Without it is reflux becomes fairly severe.  Utilizing the Zovirax regularly. No obvious side effects. Helpful in preventing recurrent herpes infections.   Review of Systems No headache, no major weight loss or weight gain, no chest pain no back pain abdominal pain no change in bowel habits complete ROS otherwise negative     Objective:   Physical Exam Alert and oriented, vitals reviewed and stable, NAD ENT-TM's and ext canals WNL bilat via otoscopic exam Soft palate, tonsils and post pharynx WNL via oropharyngeal exam Neck-symmetric, no masses; thyroid nonpalpable and nontender Pulmonary-no tachypnea or accessory muscle use; Clear without wheezes via auscultation Card--no abnrml murmurs, rhythm reg and rate WNL Carotid pulses symmetric, without bruits Urethral GC chlamydia screen declined       Assessment & Plan:  Impression 1 recurrent herpes successful suppression #2 reflux good control discussed compliance discussed #3 hyperlipidemia and impaired glucose status uncertain will check today #4 hypertension control suboptimal long discussion held regarding  options. We'll back off to diltiazem 240. In and valsartan 160. Follow-up in several months diet exercise discussed. Further recommendations based results

## 2017-02-26 ENCOUNTER — Ambulatory Visit: Payer: BLUE CROSS/BLUE SHIELD | Admitting: Family Medicine

## 2017-03-04 LAB — HEPATIC FUNCTION PANEL
ALT: 29 IU/L (ref 0–44)
AST: 23 IU/L (ref 0–40)
Albumin: 4.8 g/dL (ref 3.5–5.5)
Alkaline Phosphatase: 79 IU/L (ref 39–117)
BILIRUBIN, DIRECT: 0.16 mg/dL (ref 0.00–0.40)
Bilirubin Total: 0.6 mg/dL (ref 0.0–1.2)
TOTAL PROTEIN: 7.5 g/dL (ref 6.0–8.5)

## 2017-03-04 LAB — BASIC METABOLIC PANEL
BUN / CREAT RATIO: 9 (ref 9–20)
BUN: 12 mg/dL (ref 6–24)
CHLORIDE: 102 mmol/L (ref 96–106)
CO2: 24 mmol/L (ref 20–29)
Calcium: 9.6 mg/dL (ref 8.7–10.2)
Creatinine, Ser: 1.3 mg/dL — ABNORMAL HIGH (ref 0.76–1.27)
GFR calc Af Amer: 77 mL/min/{1.73_m2} (ref >59)
GFR calc non Af Amer: 67 mL/min/{1.73_m2} (ref >59)
GLUCOSE: 92 mg/dL (ref 65–99)
Potassium: 4.4 mmol/L (ref 3.5–5.2)
SODIUM: 143 mmol/L (ref 134–144)

## 2017-03-04 LAB — LIPID PANEL
CHOL/HDL RATIO: 5.1 ratio — AB (ref 0.0–5.0)
Cholesterol, Total: 203 mg/dL — ABNORMAL HIGH (ref 100–199)
HDL: 40 mg/dL (ref >39)
LDL Calculated: 135 mg/dL — ABNORMAL HIGH (ref 0–99)
TRIGLYCERIDES: 140 mg/dL (ref 0–149)
VLDL CHOLESTEROL CAL: 28 mg/dL (ref 5–40)

## 2017-03-04 LAB — VDRL: NON-TREPONEMAL SCREENING VDRL: NORMAL

## 2017-03-04 LAB — HIV ANTIBODY (ROUTINE TESTING W REFLEX): HIV SCREEN 4TH GENERATION: NONREACTIVE

## 2017-03-08 ENCOUNTER — Encounter: Payer: Self-pay | Admitting: Family Medicine

## 2017-03-28 ENCOUNTER — Other Ambulatory Visit: Payer: Self-pay | Admitting: Family Medicine

## 2017-04-27 ENCOUNTER — Ambulatory Visit (INDEPENDENT_AMBULATORY_CARE_PROVIDER_SITE_OTHER): Payer: BLUE CROSS/BLUE SHIELD | Admitting: Family Medicine

## 2017-04-27 ENCOUNTER — Encounter: Payer: Self-pay | Admitting: Family Medicine

## 2017-04-27 VITALS — BP 130/84 | Ht 67.5 in | Wt 191.5 lb

## 2017-04-27 DIAGNOSIS — I10 Essential (primary) hypertension: Secondary | ICD-10-CM

## 2017-04-27 NOTE — Progress Notes (Signed)
   Subjective:    Patient ID: [name], male    DOB: 1971/08/29, 46 y.o.   MRN: [id number]  Hypertension  This is a chronic problem. The current episode started more than 1 year ago.   Handling medicine well. No obvious side effects. Does not miss a dose. Blood pressures are improving generally good control. Exercising regularly.   Patient in today for a 2 month follow up on blood pressure. Review of Systems No headache, no major weight loss or weight gain, no chest pain no back pain abdominal pain no change in bowel habits complete ROS otherwise negative     Objective:   Physical Exam Alert vitals stable, NAD. Blood pressure good on repeat. HEENT normal. Lungs clear. Heart regular rate and rhythm.        Assessment & Plan:  Impression hypertension good control discussed plan follow-up in 6 months. Diet exercise discussed. Maintain same meds compliance discussed wellness plus chronic 10

## 2017-04-30 ENCOUNTER — Telehealth: Payer: Self-pay | Admitting: Family Medicine

## 2017-04-30 MED ORDER — LOSARTAN POTASSIUM 100 MG PO TABS: 100.0000 mg | ORAL_TABLET | Freq: Every day | ORAL | 5 refills | Status: DC

## 2017-04-30 NOTE — Telephone Encounter (Signed)
Losartan 100 six mo worth

## 2017-04-30 NOTE — Telephone Encounter (Signed)
Spoke with patient and informed him per Dr.Steve Luking- Valsartan was canceled and we sent in Losartan to [facility] in [place]. Patient verbalized understanding.

## 2017-04-30 NOTE — Telephone Encounter (Signed)
Patient is needing new prescription for different medication due to recall on valsartan 160 mg with [facility].

## 2017-09-29 ENCOUNTER — Other Ambulatory Visit: Payer: Self-pay | Admitting: Family Medicine

## 2017-10-26 ENCOUNTER — Encounter: Payer: Self-pay | Admitting: Family Medicine

## 2017-10-26 ENCOUNTER — Ambulatory Visit: Payer: BLUE CROSS/BLUE SHIELD | Admitting: Family Medicine

## 2017-10-26 VITALS — BP 160/104 | Temp 98.7°F | Ht 67.5 in | Wt 189.0 lb

## 2017-10-26 DIAGNOSIS — Z79899 Other long term (current) drug therapy: Secondary | ICD-10-CM | POA: Diagnosis not present

## 2017-10-26 DIAGNOSIS — I10 Essential (primary) hypertension: Secondary | ICD-10-CM

## 2017-10-26 DIAGNOSIS — E781 Pure hyperglyceridemia: Secondary | ICD-10-CM | POA: Diagnosis not present

## 2017-10-26 DIAGNOSIS — J329 Chronic sinusitis, unspecified: Secondary | ICD-10-CM | POA: Diagnosis not present

## 2017-10-26 MED ORDER — AMOXICILLIN-POT CLAVULANATE 875-125 MG PO TABS: 1.0000 | ORAL_TABLET | Freq: Two times a day (BID) | ORAL | 0 refills | Status: DC

## 2017-10-26 MED ORDER — TRIAMTERENE-HCTZ 37.5-25 MG PO CAPS: 1.0000 | ORAL_CAPSULE | Freq: Every day | ORAL | 11 refills | Status: DC

## 2017-10-26 MED ORDER — TRIAMTERENE-HCTZ 37.5-25 MG PO CAPS: 1.0000 | ORAL_CAPSULE | ORAL | 11 refills | Status: DC

## 2017-10-26 NOTE — Progress Notes (Signed)
   Subjective:    Patient ID: William Roman, male    DOB: 1971/02/26, 47 y.o.   MRN: [id number]  Sinusitis  This is a new problem. Episode onset: 2 weeks. Associated symptoms include congestion, coughing, headaches and a sore throat. (Abdominal pain, vomiting, wheezing) Treatments tried: sudafed, zyrtec.   Stopped taking losartan due to side effects (ED). Pt has a bp check up in 2 days.  Losartan  Little runny nose no sor e throat, no major problems for one and a half wk  Voice drange got bad  Cough got bad  headahce   Front headache   Took tyl every four hrs   Some productive cough    dim energy     Review of Systems  HENT: Positive for congestion and sore throat.   Respiratory: Positive for cough.   Neurological: Positive for headaches.       Objective:   Physical Exam Alert, mild malaise. Hydration good Vitals stable. frontal/ maxillary tenderness evident positive nasal congestion. pharynx normal neck supple  lungs clear/no crackles or wheezes. heart regular in rhythm        Assessment & Plan:  Impression rhinosinusitis likely post viral, discussed with patient. plan antibiotics prescribed. Questions answered. Symptomatic care discussed. warning signs discussed. WSL  #2 hypertension suboptimal control perceived side effects with losartan.  Stop at start Dyazide.  Delay physical 1 month blood work before then

## 2017-10-28 ENCOUNTER — Ambulatory Visit: Payer: BLUE CROSS/BLUE SHIELD | Admitting: Family Medicine

## 2017-11-02 ENCOUNTER — Other Ambulatory Visit: Payer: Self-pay | Admitting: Family Medicine

## 2017-12-01 ENCOUNTER — Encounter: Payer: BLUE CROSS/BLUE SHIELD | Admitting: Family Medicine

## 2017-12-18 ENCOUNTER — Encounter: Payer: Self-pay | Admitting: Nurse Practitioner

## 2017-12-18 ENCOUNTER — Encounter: Payer: Self-pay | Admitting: Family Medicine

## 2017-12-18 ENCOUNTER — Ambulatory Visit: Payer: BLUE CROSS/BLUE SHIELD | Admitting: Nurse Practitioner

## 2017-12-18 VITALS — BP 150/90 | Ht 67.5 in | Wt 191.0 lb

## 2017-12-18 DIAGNOSIS — M222X1 Patellofemoral disorders, right knee: Secondary | ICD-10-CM

## 2017-12-18 DIAGNOSIS — S86911A Strain of unspecified muscle(s) and tendon(s) at lower leg level, right leg, initial encounter: Secondary | ICD-10-CM

## 2017-12-18 DIAGNOSIS — M25561 Pain in right knee: Secondary | ICD-10-CM

## 2017-12-18 MED ORDER — DICLOFENAC SODIUM 75 MG PO TBEC: 75.0000 mg | DELAYED_RELEASE_TABLET | Freq: Two times a day (BID) | ORAL | 0 refills | Status: DC

## 2017-12-19 ENCOUNTER — Encounter: Payer: Self-pay | Admitting: Nurse Practitioner

## 2017-12-19 NOTE — Progress Notes (Addendum)
Subjective: Presents for complaints of right knee pain for the past couple of days.  No specific history of injury.  His job is very active including squatting which causes pain.  Also has some difficulty going up and down stairs.  No edema.  No erythema.  Describes pain as being on the top of his kneecap.  Is able to do weightbearing without difficulty.  Has noticed some faint popping in the knee.  Objective:   BP (!) 150/90   Ht 5' 7.5" (1.715 m)   Wt 191 lb (86.6 kg)   BMI 29.47 kg/m  NAD.  Alert, oriented.  Right knee no edema or erythema.  Patella stable with no ballottement.  No joint laxity.  Tenderness with full flexion.  No popliteal masses or cyst.  Occasional slight popping with range of motion.  Gait slow but steady.  Assessment:  Knee strain, right, initial encounter  Patellofemoral pain syndrome of right knee    Plan:   Meds ordered this encounter  Medications  . diclofenac (VOLTAREN) 75 MG EC tablet    Sig: Take 1 tablet (75 mg total) by mouth 2 (two) times daily.    Dispense:  30 tablet    Refill:  0    Order Specific Question:   Supervising Provider    Answer:   Maggie Font   Given prescription for a Velcro knee brace.  Continue ice/heat applications.  Avoid excessive strenuous activities for the next week.  Discussed his elevated blood pressure.  Patient has an appointment with Richardson Landry on 4/15 for routine follow-up.  Recheck at that time, call back sooner if needed.

## 2017-12-24 DIAGNOSIS — I10 Essential (primary) hypertension: Secondary | ICD-10-CM | POA: Diagnosis not present

## 2017-12-24 DIAGNOSIS — E781 Pure hyperglyceridemia: Secondary | ICD-10-CM | POA: Diagnosis not present

## 2017-12-24 DIAGNOSIS — Z79899 Other long term (current) drug therapy: Secondary | ICD-10-CM | POA: Diagnosis not present

## 2017-12-25 LAB — HEPATIC FUNCTION PANEL
ALK PHOS: 55 IU/L (ref 39–117)
ALT: 95 IU/L — ABNORMAL HIGH (ref 0–44)
AST: 57 IU/L — AB (ref 0–40)
Albumin: 4.4 g/dL (ref 3.5–5.5)
Bilirubin Total: 0.4 mg/dL (ref 0.0–1.2)
Bilirubin, Direct: 0.14 mg/dL (ref 0.00–0.40)
Total Protein: 6.8 g/dL (ref 6.0–8.5)

## 2017-12-25 LAB — LIPID PANEL
CHOLESTEROL TOTAL: 209 mg/dL — AB (ref 100–199)
Chol/HDL Ratio: 5.5 ratio — ABNORMAL HIGH (ref 0.0–5.0)
HDL: 38 mg/dL — AB (ref >39)
LDL Calculated: 132 mg/dL — ABNORMAL HIGH (ref 0–99)
Triglycerides: 195 mg/dL — ABNORMAL HIGH (ref 0–149)
VLDL CHOLESTEROL CAL: 39 mg/dL (ref 5–40)

## 2017-12-25 LAB — BASIC METABOLIC PANEL
BUN/Creatinine Ratio: 15 (ref 9–20)
BUN: 19 mg/dL (ref 6–24)
CHLORIDE: 102 mmol/L (ref 96–106)
CO2: 26 mmol/L (ref 20–29)
Calcium: 9.3 mg/dL (ref 8.7–10.2)
Creatinine, Ser: 1.23 mg/dL (ref 0.76–1.27)
GFR calc Af Amer: 82 mL/min/{1.73_m2} (ref >59)
GFR calc non Af Amer: 71 mL/min/{1.73_m2} (ref >59)
GLUCOSE: 98 mg/dL (ref 65–99)
POTASSIUM: 5.2 mmol/L (ref 3.5–5.2)
SODIUM: 142 mmol/L (ref 134–144)

## 2017-12-28 ENCOUNTER — Encounter: Payer: Self-pay | Admitting: Family Medicine

## 2017-12-28 ENCOUNTER — Ambulatory Visit: Payer: BLUE CROSS/BLUE SHIELD | Admitting: Family Medicine

## 2017-12-28 VITALS — BP 152/90 | Ht 67.5 in | Wt 188.1 lb

## 2017-12-28 DIAGNOSIS — R7309 Other abnormal glucose: Secondary | ICD-10-CM

## 2017-12-28 DIAGNOSIS — Z13220 Encounter for screening for lipoid disorders: Secondary | ICD-10-CM | POA: Diagnosis not present

## 2017-12-28 DIAGNOSIS — Z0000 Encounter for general adult medical examination without abnormal findings: Secondary | ICD-10-CM

## 2017-12-28 DIAGNOSIS — Z79899 Other long term (current) drug therapy: Secondary | ICD-10-CM | POA: Diagnosis not present

## 2017-12-28 MED ORDER — DILTIAZEM HCL ER COATED BEADS 240 MG PO CP24: ORAL_CAPSULE | ORAL | 5 refills | Status: DC

## 2017-12-28 MED ORDER — TRIAMTERENE-HCTZ 37.5-25 MG PO CAPS: 1.0000 | ORAL_CAPSULE | ORAL | 11 refills | Status: DC

## 2017-12-28 MED ORDER — OMEPRAZOLE 20 MG PO CPDR: 20.0000 mg | DELAYED_RELEASE_CAPSULE | Freq: Every day | ORAL | 11 refills | Status: DC

## 2017-12-28 NOTE — Patient Instructions (Addendum)

## 2017-12-28 NOTE — Progress Notes (Signed)
Subjective:    Patient ID: William Roman, male    DOB: 1971-01-25, 47 y.o.   MRN: [id number]  HPI   The patient comes in today for a wellness visit.    A review of their health history was completed.  A review of medications was also completed.  Any needed refills; No  Eating habits: Good  Falls/  MVA accidents in past few months: No  Regular exercise: Yes  Specialist pt sees on regular basis: No  Preventative health issues were discussed.   Blood pressure medicine and blood pressure levels reviewed today with patient. Compliant with blood pressure medicine. States does not miss a dose. No obvious side effects. Blood pressure generally good when checked elsewhere. Watching salt intake.  Results for orders placed or performed in visit on 10/26/17  Lipid panel  Result Value Ref Range   Cholesterol, Total 209 (H) 100 - 199 mg/dL   Triglycerides 195 (H) 0 - 149 mg/dL   HDL 38 (L) >39 mg/dL   VLDL Cholesterol Cal 39 5 - 40 mg/dL   LDL Calculated 132 (H) 0 - 99 mg/dL   Chol/HDL Ratio 5.5 (H) 0.0 - 5.0 ratio  Hepatic function panel  Result Value Ref Range   Total Protein 6.8 6.0 - 8.5 g/dL   Albumin 4.4 3.5 - 5.5 g/dL   Bilirubin Total 0.4 0.0 - 1.2 mg/dL   Bilirubin, Direct 0.14 0.00 - 0.40 mg/dL   Alkaline Phosphatase 55 39 - 117 IU/L   AST 57 (H) 0 - 40 IU/L   ALT 95 (H) 0 - 44 IU/L  Basic metabolic panel  Result Value Ref Range   Glucose 98 65 - 99 mg/dL   BUN 19 6 - 24 mg/dL   Creatinine, Ser 1.23 0.76 - 1.27 mg/dL   GFR calc non Af Amer 71 >59 mL/min/1.73   GFR calc Af Amer 82 >59 mL/min/1.73   BUN/Creatinine Ratio 15 9 - 20   Sodium 142 134 - 144 mmol/L   Potassium 5.2 3.5 - 5.2 mmol/L   Chloride 102 96 - 106 mmol/L   CO2 26 20 - 29 mmol/L   Calcium 9.3 8.7 - 10.2 mg/dL     Notes b p cont to be up at times  Still uncomfortable behind the knee cap   Pos pain and tend and  oem discomfort with  w  Additional concerns: Not sleeping well. Gets up  at two thirty in th norning,  Takes melatonin in the eve, goes to bed around 9, has to get up at 2:30 Due to overtime doig in the morn before reg hrs    Review of Systems  Constitutional: Negative for activity change, appetite change and fatigue.  HENT: Negative for congestion and rhinorrhea.   Respiratory: Negative for cough, chest tightness and shortness of breath.   Cardiovascular: Negative for chest pain and leg swelling.  Gastrointestinal: Negative for abdominal pain, diarrhea and nausea.  Endocrine: Negative for polydipsia and polyphagia.  Genitourinary: Negative for dysuria and hematuria.  Neurological: Negative for weakness and headaches.  Psychiatric/Behavioral: Negative for confusion and dysphoric mood.  All other systems reviewed and are negative.      Objective:   Physical Exam  Constitutional: He appears well-developed and well-nourished.  HENT:  Head: Normocephalic and atraumatic.  Right Ear: External ear normal.  Left Ear: External ear normal.  Nose: Nose normal.  Mouth/Throat: Oropharynx is clear and moist.  Eyes: Right eye exhibits no discharge. Left eye exhibits no  discharge. No scleral icterus.  Neck: Normal range of motion. Neck supple. No thyromegaly present.  Cardiovascular: Normal rate, regular rhythm and normal heart sounds.  No murmur heard. Pulmonary/Chest: Effort normal and breath sounds normal. No respiratory distress. He has no wheezes.  Abdominal: Soft. Bowel sounds are normal. He exhibits no distension and no mass. There is no tenderness.  Genitourinary: Penis normal.  Musculoskeletal: Normal range of motion. He exhibits no edema.  Lymphadenopathy:    He has no cervical adenopathy.  Neurological: He is alert. He exhibits normal muscle tone. Coordination normal.  Skin: Skin is warm and dry. No erythema.  Psychiatric: He has a normal mood and affect. His behavior is normal. Judgment normal.  Vitals reviewed.         Assessment & Plan:    Impression wellness exam.  Diet discussed.  Exercise discussed.  Notes his blood pressure continues to be elevated elsewhere.  Blood work discussed.  We will go ahead and double Cardizem.  Rationale discussed.  Further blood work ordered.  Recheck in several months.  WSL

## 2017-12-30 ENCOUNTER — Encounter: Payer: Self-pay | Admitting: Family Medicine

## 2018-01-12 ENCOUNTER — Telehealth: Payer: Self-pay | Admitting: Family Medicine

## 2018-01-12 NOTE — Telephone Encounter (Signed)
Dr. Richardson Landry would like to speak with this patient. Left message to return call.

## 2018-01-13 NOTE — Telephone Encounter (Signed)
Dr Richardson Landry spoke with patient 01/12/18

## 2018-01-25 DIAGNOSIS — Z79899 Other long term (current) drug therapy: Secondary | ICD-10-CM | POA: Diagnosis not present

## 2018-01-25 DIAGNOSIS — Z13220 Encounter for screening for lipoid disorders: Secondary | ICD-10-CM | POA: Diagnosis not present

## 2018-01-25 DIAGNOSIS — R7309 Other abnormal glucose: Secondary | ICD-10-CM | POA: Diagnosis not present

## 2018-01-26 LAB — LIPID PANEL
CHOLESTEROL TOTAL: 192 mg/dL (ref 100–199)
Chol/HDL Ratio: 5.3 ratio — ABNORMAL HIGH (ref 0.0–5.0)
HDL: 36 mg/dL — ABNORMAL LOW (ref >39)
LDL Calculated: 119 mg/dL — ABNORMAL HIGH (ref 0–99)
Triglycerides: 184 mg/dL — ABNORMAL HIGH (ref 0–149)
VLDL CHOLESTEROL CAL: 37 mg/dL (ref 5–40)

## 2018-01-26 LAB — HEPATIC FUNCTION PANEL
ALBUMIN: 4.7 g/dL (ref 3.5–5.5)
ALK PHOS: 61 IU/L (ref 39–117)
ALT: 37 IU/L (ref 0–44)
AST: 23 IU/L (ref 0–40)
BILIRUBIN TOTAL: 0.5 mg/dL (ref 0.0–1.2)
BILIRUBIN, DIRECT: 0.13 mg/dL (ref 0.00–0.40)
TOTAL PROTEIN: 6.9 g/dL (ref 6.0–8.5)

## 2018-01-26 LAB — GLUCOSE, RANDOM: GLUCOSE: 102 mg/dL — AB (ref 65–99)

## 2018-02-25 ENCOUNTER — Other Ambulatory Visit: Payer: Self-pay | Admitting: Family Medicine

## 2018-03-29 ENCOUNTER — Other Ambulatory Visit: Payer: Self-pay | Admitting: Family Medicine

## 2018-03-29 ENCOUNTER — Ambulatory Visit: Payer: BLUE CROSS/BLUE SHIELD | Admitting: Family Medicine

## 2018-03-29 ENCOUNTER — Encounter: Payer: Self-pay | Admitting: Family Medicine

## 2018-03-29 VITALS — BP 152/90 | Ht 67.5 in | Wt 186.8 lb

## 2018-03-29 DIAGNOSIS — I10 Essential (primary) hypertension: Secondary | ICD-10-CM | POA: Diagnosis not present

## 2018-03-29 MED ORDER — DILTIAZEM HCL ER COATED BEADS 240 MG PO CP24: ORAL_CAPSULE | ORAL | 5 refills | Status: DC

## 2018-03-29 MED ORDER — TRIAMTERENE-HCTZ 37.5-25 MG PO CAPS: 1.0000 | ORAL_CAPSULE | Freq: Every day | ORAL | 5 refills | Status: DC

## 2018-03-29 NOTE — Progress Notes (Signed)
   Subjective:    Patient ID: William Roman, male    DOB: August 24, 1971, 47 y.o.   MRN: [id number]  Hypertension  This is a chronic problem. The current episode started more than 1 year ago. Risk factors for coronary artery disease include male gender. Treatments tried: dyazide. There are no compliance problems.    Blood pressure medicine and blood pressure levels reviewed today with patient. Compliant with blood pressure medicine. States does not miss a dose. No obvious side effects. Blood pressure generally good when checked elsewhere. Watching salt intake.   Taking meds faithfully  Some incr stres at work for the past mo  Doing a lot of extra walking  168 over 84   diab in the family no hx of high blood presure in parent  Review of Systems No headache, no major weight loss or weight gain, no chest pain no back pain abdominal pain no change in bowel habits complete ROS otherwise negative     Objective:   Physical Exam   Alert vitals stable, NAD. Blood pressure good on repeat. HEENT normal. Lungs clear. Heart regular rate and rhythm.      Assessment & Plan:  Testing impression hypertension.  Control improved on medication.  Compliance discussed diet exercise discussed maintain same meds follow-up in 6 months
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
## 2018-05-14 ENCOUNTER — Other Ambulatory Visit: Payer: Self-pay | Admitting: Family Medicine

## 2018-08-02 ENCOUNTER — Other Ambulatory Visit: Payer: Self-pay | Admitting: Family Medicine

## 2018-09-27 ENCOUNTER — Ambulatory Visit: Payer: BLUE CROSS/BLUE SHIELD | Admitting: Family Medicine

## 2018-10-29 ENCOUNTER — Other Ambulatory Visit: Payer: Self-pay | Admitting: Family Medicine

## 2018-11-28 ENCOUNTER — Other Ambulatory Visit: Payer: Self-pay | Admitting: Family Medicine

## 2018-12-12 ENCOUNTER — Other Ambulatory Visit: Payer: Self-pay | Admitting: Family Medicine

## 2018-12-13 NOTE — Telephone Encounter (Signed)
Pt returned call and was informed that he would need a virtual visit this week. Pt verbalized understanding and was transferred up front to set up appt.

## 2018-12-13 NOTE — Telephone Encounter (Signed)
virt visit this week

## 2018-12-13 NOTE — Telephone Encounter (Signed)
Left message to return call 

## 2018-12-14 ENCOUNTER — Ambulatory Visit (INDEPENDENT_AMBULATORY_CARE_PROVIDER_SITE_OTHER): Payer: BLUE CROSS/BLUE SHIELD | Admitting: Family Medicine

## 2018-12-14 ENCOUNTER — Other Ambulatory Visit: Payer: Self-pay

## 2018-12-14 ENCOUNTER — Encounter: Payer: Self-pay | Admitting: Family Medicine

## 2018-12-14 VITALS — Ht 67.5 in | Wt 199.0 lb

## 2018-12-14 DIAGNOSIS — Z029 Encounter for administrative examinations, unspecified: Secondary | ICD-10-CM

## 2018-12-14 DIAGNOSIS — I10 Essential (primary) hypertension: Secondary | ICD-10-CM

## 2018-12-14 MED ORDER — OMEPRAZOLE 20 MG PO CPDR: 20.0000 mg | DELAYED_RELEASE_CAPSULE | Freq: Every day | ORAL | 5 refills | Status: DC

## 2018-12-14 MED ORDER — DILTIAZEM HCL ER COATED BEADS 240 MG PO CP24: ORAL_CAPSULE | ORAL | 0 refills | Status: DC

## 2018-12-14 MED ORDER — DILTIAZEM HCL ER COATED BEADS 240 MG PO CP24: 240.0000 mg | ORAL_CAPSULE | Freq: Every day | ORAL | 5 refills | Status: DC

## 2018-12-14 MED ORDER — TRIAMTERENE-HCTZ 37.5-25 MG PO CAPS: 1.0000 | ORAL_CAPSULE | Freq: Every day | ORAL | 5 refills | Status: DC

## 2018-12-14 NOTE — Progress Notes (Signed)
   Subjective:    Patient ID: William Roman, male    DOB: Jun 07, 1971, 48 y.o.   MRN: [id number]  Hypertension  This is a chronic problem. There are no compliance problems.    Pt states he has been doing well. Pt had home health nurse come out to home on Saturday do to changing insurances and checked BP and it was 138/82. Pt states he has also switched job.   Blood pressure medicine and blood pressure levels reviewed today with patient. Compliant with blood pressure medicine. States does not miss a dose. No obvious side effects. Blood pressure generally good when checked elsewhere. Watching salt intake.  Has changed jobs   Virtual Visit via Telephone Note  I connected with William Roman on 12/14/18 at 10:00 AM EDT by telephone and verified that I am speaking with the correct person using two identifiers.   I discussed the limitations, risks, security and privacy concerns of performing an evaluation and management service by telephone and the availability of in person appointments. I also discussed with the patient that there may be a patient responsible charge related to this service. The patient expressed understanding and agreed to proceed.   History of Present Illness:    Observations/Objective:   Assessment and Plan:   Follow Up Instructions:    I discussed the assessment and treatment plan with the patient. The patient was provided an opportunity to ask questions and all were answered. The patient agreed with the plan and demonstrated an understanding of the instructions.   The patient was advised to call back or seek an in-person evaluation if the symptoms worsen or if the condition fails to improve as anticipated.  I provided 15 minutes of non-face-to-face time during this encounter.   William Males, LPN  Review of Systems No headache, no major weight loss or weight gain, no chest pain no back pain abdominal pain no change in bowel habits complete ROS  otherwise negative     Objective:   Physical Exam   Alert vitals stable, NAD. Blood pressure good on repeat. HEENT normal. Lungs clear. Heart regular rate and rhythm.      Assessment & Plan:  Impression hypertension.  Good control discussed to maintain same medications.  Diet exercise discussed.  Follow-up in 6 months.

## 2019-01-03 ENCOUNTER — Other Ambulatory Visit: Payer: Self-pay | Admitting: Family Medicine

## 2019-01-09 ENCOUNTER — Other Ambulatory Visit: Payer: Self-pay | Admitting: Family Medicine

## 2019-02-09 ENCOUNTER — Other Ambulatory Visit: Payer: Self-pay | Admitting: Family Medicine

## 2019-03-10 ENCOUNTER — Other Ambulatory Visit: Payer: Self-pay | Admitting: Family Medicine

## 2019-04-09 ENCOUNTER — Other Ambulatory Visit: Payer: Self-pay | Admitting: Family Medicine

## 2019-04-20 ENCOUNTER — Telehealth: Payer: Self-pay | Admitting: Family Medicine

## 2019-04-20 ENCOUNTER — Ambulatory Visit (INDEPENDENT_AMBULATORY_CARE_PROVIDER_SITE_OTHER): Payer: BLUE CROSS/BLUE SHIELD | Admitting: Family Medicine

## 2019-04-20 ENCOUNTER — Other Ambulatory Visit: Payer: Self-pay

## 2019-04-20 DIAGNOSIS — Z20822 Contact with and (suspected) exposure to covid-19: Secondary | ICD-10-CM

## 2019-04-20 DIAGNOSIS — R6889 Other general symptoms and signs: Secondary | ICD-10-CM

## 2019-04-20 NOTE — Telephone Encounter (Signed)
Ov this eve

## 2019-04-20 NOTE — Progress Notes (Signed)
   Subjective:  Audio  Patient ID: William Roman, male    DOB: [birth year], 48 y.o.   MRN: [id number]   HPI Pt having sudden loss of taste. Pt states it possible could have started last night but really noticed it this morning. No other symptoms. Pt is out of town working in [place], [place].   Virtual Visit via Video Note  I connected with William Roman on 04/20/19 at  4:10 PM EDT by a video enabled telemedicine application and verified that I am speaking with the correct person using two identifiers.  Location: Patient: home Provider: office   I discussed the limitations of evaluation and management by telemedicine and the availability of in person appointments. The patient expressed understanding and agreed to proceed.  History of Present Illness:    Observations/Objective:   Assessment and Plan:   Follow Up Instructions:    I discussed the assessment and treatment plan with the patient. The patient was provided an opportunity to ask questions and all were answered. The patient agreed with the plan and demonstrated an understanding of the instructions.   The patient was advised to call back or seek an in-person evaluation if the symptoms worsen or if the condition fails to improve as anticipated.  I provided 15 minutes of non-face-to-face time during this encounter.  No cough no congestion no runny nose.  Simply loss of taste and loss of smell.  No headache no muscle aches Vicente Males, LPN    Review of Systems No vomiting no diarrhea    Objective:   Physical Exam  Virtual      Assessment & Plan:  Impression sudden loss of smell and taste.  Long discussion held.  Probably COVID-19 infection discussed patient plans on coming home getting a swab to check warning signs discussed.  Since this features pathognomonic patient advised that what ever the results should consider that he has true virus infection.  Quarantining discussed

## 2019-04-20 NOTE — Telephone Encounter (Signed)
Patient scheduled virtual visit today with Dr Richardson Landry.

## 2019-04-20 NOTE — Telephone Encounter (Signed)
Patient is calling about having no taste that started today was fine yesterday and has no other symptoms He has been wearing a mask when out and at work.Marland Kitchen He wanted to know what to do. Please advise.

## 2019-04-21 ENCOUNTER — Other Ambulatory Visit: Payer: Self-pay

## 2019-04-21 DIAGNOSIS — Z20822 Contact with and (suspected) exposure to covid-19: Secondary | ICD-10-CM

## 2019-04-22 LAB — NOVEL CORONAVIRUS, NAA: SARS-CoV-2, NAA: NOT DETECTED

## 2019-05-06 ENCOUNTER — Ambulatory Visit (INDEPENDENT_AMBULATORY_CARE_PROVIDER_SITE_OTHER): Payer: BC Managed Care – PPO | Admitting: Family Medicine

## 2019-05-06 ENCOUNTER — Other Ambulatory Visit: Payer: Self-pay

## 2019-05-06 DIAGNOSIS — M79641 Pain in right hand: Secondary | ICD-10-CM | POA: Diagnosis not present

## 2019-05-06 DIAGNOSIS — M79642 Pain in left hand: Secondary | ICD-10-CM

## 2019-05-06 MED ORDER — PREDNISONE 20 MG PO TABS: ORAL_TABLET | ORAL | 0 refills | Status: DC

## 2019-05-06 NOTE — Progress Notes (Signed)
   Subjective:  Audio only  Patient ID: William Roman, male    DOB: June 26, 1971, 48 y.o.   MRN: [id number]  HPI  Patient calls to discuss problems with trigger fingers. Patient states he has trigger finger issues with all his fingers except his thumbs.  This is been progressing over the past 2 years.  Using his hands a lot now.  Both hands are locking up consistently throughout the day.  Multiple knuckles.  Patient saw a hand surgeon in the past when he just had one trigger finger.  Things have worsened recently with excessive work and demands  Fairly classic trigger finger description in multiple fingers both hands Review of Systems No headache, no major weight loss or weight gain, no chest pain no back pain abdominal pain no change in bowel habits complete ROS otherwise negative     Objective:   Physical Exam  Virtual      Assessment & Plan:  Impression trigger finger multiple fingers both hands discussion held.  Time to get back to the hand surgeon rationale discussed we will work on referral.  Meanwhile give 1 prednisone taper to calm down inflammation hopefully help some of this

## 2019-05-07 ENCOUNTER — Encounter: Payer: Self-pay | Admitting: Family Medicine

## 2019-05-09 ENCOUNTER — Encounter: Payer: Self-pay | Admitting: Family Medicine

## 2019-05-09 ENCOUNTER — Other Ambulatory Visit: Payer: Self-pay | Admitting: Family Medicine

## 2019-05-24 DIAGNOSIS — M65352 Trigger finger, left little finger: Secondary | ICD-10-CM | POA: Diagnosis not present

## 2019-05-24 DIAGNOSIS — M8708 Idiopathic aseptic necrosis of bone, other site: Secondary | ICD-10-CM | POA: Diagnosis not present

## 2019-05-24 DIAGNOSIS — M8700 Idiopathic aseptic necrosis of unspecified bone: Secondary | ICD-10-CM | POA: Diagnosis not present

## 2019-05-24 DIAGNOSIS — M65341 Trigger finger, right ring finger: Secondary | ICD-10-CM | POA: Diagnosis not present

## 2019-05-24 DIAGNOSIS — M6530 Trigger finger, unspecified finger: Secondary | ICD-10-CM | POA: Diagnosis not present

## 2019-05-24 DIAGNOSIS — M65331 Trigger finger, right middle finger: Secondary | ICD-10-CM | POA: Diagnosis not present

## 2019-07-08 ENCOUNTER — Other Ambulatory Visit: Payer: Self-pay | Admitting: Family Medicine

## 2019-07-10 NOTE — Telephone Encounter (Signed)
Ok one mo, needs six mo ck up

## 2019-07-11 NOTE — Telephone Encounter (Signed)
Please schedule and then let nurses know so we can send in refill

## 2019-07-12 NOTE — Telephone Encounter (Signed)
Patient scheduled for Monday and said he can wait until his appt for refills

## 2019-07-18 ENCOUNTER — Encounter: Payer: Self-pay | Admitting: Family Medicine

## 2019-07-18 ENCOUNTER — Ambulatory Visit (INDEPENDENT_AMBULATORY_CARE_PROVIDER_SITE_OTHER): Payer: BLUE CROSS/BLUE SHIELD | Admitting: Family Medicine

## 2019-07-18 ENCOUNTER — Other Ambulatory Visit: Payer: Self-pay

## 2019-07-18 DIAGNOSIS — Z20828 Contact with and (suspected) exposure to other viral communicable diseases: Secondary | ICD-10-CM

## 2019-07-18 DIAGNOSIS — E781 Pure hyperglyceridemia: Secondary | ICD-10-CM

## 2019-07-18 DIAGNOSIS — I10 Essential (primary) hypertension: Secondary | ICD-10-CM | POA: Diagnosis not present

## 2019-07-18 DIAGNOSIS — Z79899 Other long term (current) drug therapy: Secondary | ICD-10-CM

## 2019-07-18 DIAGNOSIS — Z20822 Contact with and (suspected) exposure to covid-19: Secondary | ICD-10-CM

## 2019-07-18 MED ORDER — OMEPRAZOLE 20 MG PO CPDR: DELAYED_RELEASE_CAPSULE | ORAL | 5 refills | Status: DC

## 2019-07-18 MED ORDER — DILTIAZEM HCL ER COATED BEADS 240 MG PO CP24: 240.0000 mg | ORAL_CAPSULE | Freq: Two times a day (BID) | ORAL | 5 refills | Status: DC

## 2019-07-18 MED ORDER — TRIAMTERENE-HCTZ 37.5-25 MG PO CAPS: 1.0000 | ORAL_CAPSULE | Freq: Every day | ORAL | 5 refills | Status: DC

## 2019-07-18 NOTE — Progress Notes (Signed)
   Subjective:  Audio only patient intact connects with multiple concerns  Patient ID: William Roman, male    DOB: [birth year], 48 y.o.   MRN: [id number]  Hypertension This is a chronic problem. There are no compliance problems.    Congestion, cough from throat being irritated. Started 2 days ago. Tried sudafed.  Patient does travel a lot with his job.  Also participating outdoor bonfires etc. and one false alarm earlier in the year.  Mild headache.  Some throat irritation.  Fairly significant last several days.  Relates it to not taking Claritin 1 days  Trigger finger. Went to specialist and had injection. Would like to discuss taking a b6 supplement for trigger finger.  Had an injection earlier this year.  It helped her to trigger fingers.  However it is more issues.  We searched the Internet and was learning that B6 might help.  Wonders about dose  Virtual Visit via Telephone Note  I connected with William Roman on 07/18/19 at  8:30 AM EST by telephone and verified that I am speaking with the correct person using two identifiers.  Location: Patient: home Provider: office   I discussed the limitations, risks, security and privacy concerns of performing an evaluation and management service by telephone and the availability of in person appointments. I also discussed with the patient that there may be a patient responsible charge related to this service. The patient expressed understanding and agreed to proceed.   History of Present Illness:    Observations/Objective:   Assessment and Plan:   Follow Up Instructions:    I discussed the assessment and treatment plan with the patient. The patient was provided an opportunity to ask questions and all were answered. The patient agreed with the plan and demonstrated an understanding of the instructions.   The patient was advised to call back or seek an in-person evaluation if the symptoms worsen or if the condition fails to  improve as anticipated.  I provided 25 minutes of non-face-to-face time during this encounter.  Blood pressure medicine and blood pressure levels reviewed today with patient. Compliant with blood pressure medicine. States does not miss a dose. No obvious side effects. Blood pressure generally good when checked elsewhere. Watching salt intake.    Review of Systems No headache, no major weight loss or weight gain, no chest pain no back pain abdominal pain no change in bowel habits complete ROS otherwise negative     Objective:   Physical Exam   Virtual     Assessment & Plan:  Impression 1 hypertension.  Currently controlled discussed compliance with medications refilled for 6 months  2.  Trigger finger.  Worsening developing another fingers.  Questions B6 dose.  Do not exceed 50 mg/day rationale discussed  3.  Suspected COVID-19.  They have simple URI.  May be simple flare of allergies.  However with symptoms and lifestyle work necessities recommend COVID-19 testing rationale discussed

## 2019-07-19 LAB — NOVEL CORONAVIRUS, NAA: SARS-CoV-2, NAA: NOT DETECTED

## 2019-07-21 ENCOUNTER — Telehealth: Payer: Self-pay | Admitting: Family Medicine

## 2019-07-21 MED ORDER — CEFDINIR 300 MG PO CAPS: ORAL_CAPSULE | ORAL | 0 refills | Status: DC

## 2019-07-21 NOTE — Telephone Encounter (Signed)
Had a phone visit on Monday. Covid test came back negative.  Still having a lot of Congestion, h/a, ears popping and a lot of drainage and cough. Wanted to know if we would call an antibiotic in.   [facility] [address]

## 2019-07-21 NOTE — Telephone Encounter (Signed)
Please advise. Thank you

## 2019-07-21 NOTE — Telephone Encounter (Signed)
Medication sent in and pt is aware  

## 2019-07-21 NOTE — Telephone Encounter (Signed)
omnicef 300 bid te3n d

## 2019-08-10 ENCOUNTER — Other Ambulatory Visit: Payer: Self-pay

## 2019-08-10 DIAGNOSIS — Z20822 Contact with and (suspected) exposure to covid-19: Secondary | ICD-10-CM

## 2019-08-12 LAB — NOVEL CORONAVIRUS, NAA: SARS-CoV-2, NAA: NOT DETECTED

## 2019-08-16 ENCOUNTER — Other Ambulatory Visit: Payer: Self-pay

## 2019-08-16 DIAGNOSIS — Z20822 Contact with and (suspected) exposure to covid-19: Secondary | ICD-10-CM

## 2019-08-18 LAB — NOVEL CORONAVIRUS, NAA: SARS-CoV-2, NAA: NOT DETECTED

## 2019-10-18 ENCOUNTER — Ambulatory Visit (INDEPENDENT_AMBULATORY_CARE_PROVIDER_SITE_OTHER): Payer: BLUE CROSS/BLUE SHIELD | Admitting: Family Medicine

## 2019-10-18 ENCOUNTER — Encounter: Payer: Self-pay | Admitting: Family Medicine

## 2019-10-18 ENCOUNTER — Other Ambulatory Visit: Payer: Self-pay

## 2019-10-18 VITALS — Temp 98.2°F | Ht 67.5 in | Wt 219.2 lb

## 2019-10-18 DIAGNOSIS — M65349 Trigger finger, unspecified ring finger: Secondary | ICD-10-CM

## 2019-10-18 MED ORDER — PREDNISONE 20 MG PO TABS: ORAL_TABLET | ORAL | 0 refills | Status: DC

## 2019-10-18 NOTE — Progress Notes (Signed)
   Subjective:    Patient ID: William Roman, male    DOB: 1971/07/03, 49 y.o.   MRN: [id number]  HPIright ring finger locked up around 5 am this morning. States it has happened before but he was able to get it open but not this time. Tried ice and heat.   Patient went to hand surgeon as referred.  Was concerned about high cost of potential surgical intervention hold off on that.  He sought out a group in [place] that does plasma injection.  This seemed to help somewhat.  Today had the finger locked up and unable to open.  Concerned about opening it he may break something  No headache, no major weight loss or weight gain, no chest pain no back pain abdominal pain no change in bowel habits complete ROS otherwise negative  Review of Systems    Objective:   Physical Exam  Alert vitals stable, NAD. Blood pressure good on repeat. HEENT normal. Lungs clear. Heart regular rate and rhythm. Right ring finger impressively locked with gentle pressure was able to extend through an open left hand normally again      Assessment & Plan:  Impression trigger finger.  Options discussed.  Patient would like a second opinion with a different hand surgeon we will get the swelling

## 2019-10-18 NOTE — Telephone Encounter (Signed)
Pt called this morning before seeing this message and scheduled ov at 11 am with dr Richardson Landry

## 2019-10-19 ENCOUNTER — Encounter: Payer: Self-pay | Admitting: Family Medicine

## 2019-11-01 ENCOUNTER — Encounter: Payer: Self-pay | Admitting: Family Medicine

## 2020-01-31 ENCOUNTER — Encounter: Payer: Self-pay | Admitting: Family Medicine

## 2020-01-31 ENCOUNTER — Other Ambulatory Visit: Payer: Self-pay

## 2020-01-31 ENCOUNTER — Ambulatory Visit: Payer: BLUE CROSS/BLUE SHIELD | Admitting: Family Medicine

## 2020-01-31 VITALS — BP 148/90 | Temp 97.6°F | Ht 67.5 in | Wt 217.0 lb

## 2020-01-31 DIAGNOSIS — I10 Essential (primary) hypertension: Secondary | ICD-10-CM | POA: Diagnosis not present

## 2020-01-31 DIAGNOSIS — M65349 Trigger finger, unspecified ring finger: Secondary | ICD-10-CM | POA: Diagnosis not present

## 2020-01-31 DIAGNOSIS — M79642 Pain in left hand: Secondary | ICD-10-CM

## 2020-01-31 DIAGNOSIS — R7689 Other specified abnormal immunological findings in serum: Secondary | ICD-10-CM

## 2020-01-31 DIAGNOSIS — R768 Other specified abnormal immunological findings in serum: Secondary | ICD-10-CM

## 2020-01-31 DIAGNOSIS — M79641 Pain in right hand: Secondary | ICD-10-CM

## 2020-01-31 NOTE — Progress Notes (Signed)
   Subjective:    Patient ID: William Roman, male    DOB: 09/10/1971, 49 y.o.   MRN: [id number]  HPIfollow up on lab results. Did bw due to joint paint and fingers locking up.   Hands are giving the pt the most pain  Notes chronic knees and ankle pain   Notes no swelling and warm to touch   Everybody says his hadns look swollen and fluid   Seems  Hands will swell up a bit  Pt had a great gr mo who developed  Rheum arthritis like trouble s          Review of Systems No headache no chest pain no shortness of breath    Objective:   Physical Exam  Alert vitals stable, NAD. Blood pressure good on repeat. HEENT normal. Lungs clear. Heart regular rate and rhythm. And is positive tenderness bilateral at the medicine carpal phalangeal junction in the palms.  Positive trigger fingers both hands slight diffuse swelling of the hands.  No obvious synovial thickening      Assessment & Plan:  Impression 1 chronic hand pain with multiple trigger fingers.  Appears to be primarily an orthopedic problem.  Patient elected to go on for experimental plasma injections apparently had many series of these at a high cost did not do his hands much good.  No better no worse.  Any apparently ordered blood work on his own, including an ANA which returns positive concerned about this.  Rheumatology referral to rule out rheumatological difficulties with hands.  If this referral is eventually noncontributory, strongly recommend patient get back to a hand specialist.  Either the prior one who saw him or thing you want.  Rationale discussed.  2.  Hypertension good control discussed maintain same meds  3.  Borderline low GFR.  Discussed.  Hydration encouraged along with yearly checking of numbers  Greater than 50% of this 30 minute face to face visit was spent in counseling and discussion and coordination of care regarding the above diagnosis/diagnosies

## 2020-02-09 ENCOUNTER — Other Ambulatory Visit: Payer: Self-pay | Admitting: Family Medicine

## 2020-02-20 ENCOUNTER — Encounter: Payer: Self-pay | Admitting: Family Medicine

## 2020-03-12 DIAGNOSIS — M2550 Pain in unspecified joint: Secondary | ICD-10-CM | POA: Diagnosis not present

## 2020-03-12 DIAGNOSIS — R768 Other specified abnormal immunological findings in serum: Secondary | ICD-10-CM | POA: Diagnosis not present

## 2020-03-12 DIAGNOSIS — N1831 Chronic kidney disease, stage 3a: Secondary | ICD-10-CM | POA: Diagnosis not present

## 2020-03-12 DIAGNOSIS — M6530 Trigger finger, unspecified finger: Secondary | ICD-10-CM | POA: Diagnosis not present

## 2020-03-12 DIAGNOSIS — M791 Myalgia, unspecified site: Secondary | ICD-10-CM | POA: Diagnosis not present

## 2020-04-06 DIAGNOSIS — M6530 Trigger finger, unspecified finger: Secondary | ICD-10-CM | POA: Diagnosis not present

## 2020-04-06 DIAGNOSIS — R768 Other specified abnormal immunological findings in serum: Secondary | ICD-10-CM | POA: Diagnosis not present

## 2020-04-06 DIAGNOSIS — M109 Gout, unspecified: Secondary | ICD-10-CM | POA: Diagnosis not present

## 2020-04-06 DIAGNOSIS — M2550 Pain in unspecified joint: Secondary | ICD-10-CM | POA: Diagnosis not present

## 2020-05-04 DIAGNOSIS — M109 Gout, unspecified: Secondary | ICD-10-CM | POA: Diagnosis not present

## 2020-06-01 DIAGNOSIS — M109 Gout, unspecified: Secondary | ICD-10-CM | POA: Diagnosis not present

## 2020-07-13 DIAGNOSIS — M150 Primary generalized (osteo)arthritis: Secondary | ICD-10-CM | POA: Diagnosis not present

## 2020-07-13 DIAGNOSIS — Z23 Encounter for immunization: Secondary | ICD-10-CM | POA: Diagnosis not present

## 2020-07-13 DIAGNOSIS — M6530 Trigger finger, unspecified finger: Secondary | ICD-10-CM | POA: Diagnosis not present

## 2020-07-13 DIAGNOSIS — M109 Gout, unspecified: Secondary | ICD-10-CM | POA: Diagnosis not present

## 2020-07-13 DIAGNOSIS — M2550 Pain in unspecified joint: Secondary | ICD-10-CM | POA: Diagnosis not present

## 2020-08-08 ENCOUNTER — Other Ambulatory Visit: Payer: Self-pay | Admitting: *Deleted

## 2020-08-08 NOTE — Telephone Encounter (Signed)
Sent my chart message to schedule appointment.

## 2020-08-14 DIAGNOSIS — M150 Primary generalized (osteo)arthritis: Secondary | ICD-10-CM | POA: Diagnosis not present

## 2020-08-22 NOTE — Telephone Encounter (Signed)
We have to try to contact pt 3 times before I can close message per dr Nicki Reaper. Please try again.

## 2020-08-22 NOTE — Telephone Encounter (Signed)
Call patient twice and left my chart message twic to schedule appointment

## 2020-09-10 NOTE — Telephone Encounter (Signed)
Made med Check appt for Jan 6th

## 2020-09-12 DIAGNOSIS — M109 Gout, unspecified: Secondary | ICD-10-CM | POA: Diagnosis not present

## 2020-09-12 DIAGNOSIS — M6530 Trigger finger, unspecified finger: Secondary | ICD-10-CM | POA: Diagnosis not present

## 2020-09-12 DIAGNOSIS — M2550 Pain in unspecified joint: Secondary | ICD-10-CM | POA: Diagnosis not present

## 2020-09-12 DIAGNOSIS — M150 Primary generalized (osteo)arthritis: Secondary | ICD-10-CM | POA: Diagnosis not present

## 2020-09-20 ENCOUNTER — Encounter: Payer: Self-pay | Admitting: Family Medicine

## 2020-09-20 ENCOUNTER — Ambulatory Visit: Payer: BLUE CROSS/BLUE SHIELD | Admitting: Family Medicine

## 2020-09-20 ENCOUNTER — Other Ambulatory Visit: Payer: Self-pay

## 2020-09-20 VITALS — BP 152/92 | HR 82 | Temp 97.7°F | Ht 67.5 in | Wt 214.6 lb

## 2020-09-20 DIAGNOSIS — K219 Gastro-esophageal reflux disease without esophagitis: Secondary | ICD-10-CM | POA: Diagnosis not present

## 2020-09-20 DIAGNOSIS — I10 Essential (primary) hypertension: Secondary | ICD-10-CM

## 2020-09-20 DIAGNOSIS — E781 Pure hyperglyceridemia: Secondary | ICD-10-CM

## 2020-09-20 MED ORDER — TRIAMTERENE-HCTZ 37.5-25 MG PO CAPS: 1.0000 | ORAL_CAPSULE | Freq: Every day | ORAL | 0 refills | Status: DC

## 2020-09-20 MED ORDER — DILTIAZEM HCL ER COATED BEADS 240 MG PO CP24: ORAL_CAPSULE | ORAL | 0 refills | Status: DC

## 2020-09-20 MED ORDER — OMEPRAZOLE 20 MG PO CPDR: DELAYED_RELEASE_CAPSULE | ORAL | 50 refills | Status: DC

## 2020-09-20 NOTE — Progress Notes (Signed)
Patient ID: William Roman, male    DOB: 11-08-1970, 50 y.o.   MRN: [id number]   Chief Complaint  Patient presents with  . Hypertension    Follow up- cut back on BP meds because he was running low   Subjective:    HPI  Patient seen for follow-up on hypertension.  Patient taking Cardizem and triamterene/HCTZ.  Patient stating he was about to run out of medication so has been taking only half of his medication daily.  And his blood pressures have been running high.  Denies chest pain, sob, leg swelling, dizziness, or HA.  Pt seen by rheum and h/o gout.  And improved the gout and swelling in hands.   Patient has not had labs completed.  Pt having intermittent abd pain on rt side, thinking he may have a "hernia." not having a mass, n/v/d, fever, or severe pain.   H/o GERD- stable on prilosec.  Medical History William Roman has a past medical history of Allergic rhinitis, Anxiety, Atrial fibrillation ([place]), Depression, Elevated glucose, Hypertension, Reflux, and Stress.   Outpatient Encounter Medications as of 09/20/2020  Medication Sig  . acyclovir (ZOVIRAX) 400 MG tablet TAKE 1 TABLET(400 MG) BY MOUTH TWICE DAILY  . diclofenac (VOLTAREN) 75 MG EC tablet Take 1 tablet (75 mg total) by mouth 2 (two) times daily.  Marland Kitchen diltiazem (CARDIZEM CD) 240 MG 24 hr capsule TAKE 1 CAPSULE(240 MG) BY MOUTH TWICE DAILY  . Multiple Vitamins-Minerals (MULTIVITAMIN WITH MINERALS) tablet Take 1 tablet by mouth daily.  Marland Kitchen omeprazole (PRILOSEC) 20 MG capsule TAKE 1 CAPSULE(20 MG) BY MOUTH DAILY  . triamterene-hydrochlorothiazide (DYAZIDE) 37.5-25 MG capsule Take 1 each (1 capsule total) by mouth daily.  . [DISCONTINUED] diltiazem (CARDIZEM CD) 240 MG 24 hr capsule TAKE 1 CAPSULE(240 MG) BY MOUTH TWICE DAILY  . [DISCONTINUED] omeprazole (PRILOSEC) 20 MG capsule TAKE 1 CAPSULE(20 MG) BY MOUTH DAILY  . [DISCONTINUED] triamterene-hydrochlorothiazide (DYAZIDE) 37.5-25 MG capsule TAKE 1 CAPSULE BY MOUTH DAILY   No  facility-administered encounter medications on file as of 09/20/2020.     Review of Systems  Constitutional: Negative for chills and fever.  HENT: Negative for congestion, rhinorrhea and sore throat.   Respiratory: Negative for cough, shortness of breath and wheezing.   Cardiovascular: Negative for chest pain and leg swelling.  Gastrointestinal: Positive for abdominal pain (rt mid abd, intermittent pain). Negative for blood in stool, diarrhea, nausea and vomiting.  Genitourinary: Negative for dysuria and frequency.  Skin: Negative for rash.  Neurological: Negative for dizziness, weakness and headaches.     Vitals BP (!) 152/92 Comment: running low on BP med-not taking as directed  Pulse 82   Temp 97.7 F (36.5 C) (Oral)   Ht 5' 7.5" (1.715 m)   Wt 214 lb 9.6 oz (97.3 kg)   SpO2 96%   BMI 33.12 kg/m   Objective:   Physical Exam Vitals and nursing note reviewed.  Constitutional:      General: He is not in acute distress.    Appearance: Normal appearance. He is not ill-appearing.  HENT:     Head: Normocephalic.     Nose: Nose normal. No congestion.     Mouth/Throat:     Mouth: Mucous membranes are moist.     Pharynx: No oropharyngeal exudate.  Eyes:     Extraocular Movements: Extraocular movements intact.     Conjunctiva/sclera: Conjunctivae normal.     Pupils: Pupils are equal, round, and reactive to light.  Cardiovascular:  Rate and Rhythm: Normal rate and regular rhythm.     Pulses: Normal pulses.     Heart sounds: Normal heart sounds. No murmur heard.   Pulmonary:     Effort: Pulmonary effort is normal.     Breath sounds: Normal breath sounds. No wheezing, rhonchi or rales.  Musculoskeletal:        General: Normal range of motion.     Right lower leg: No edema.     Left lower leg: No edema.  Skin:    General: Skin is warm and dry.     Findings: No rash.  Neurological:     General: No focal deficit present.     Mental Status: He is alert and oriented to  person, place, and time.     Cranial Nerves: No cranial nerve deficit.  Psychiatric:        Mood and Affect: Mood normal.        Behavior: Behavior normal.        Thought Content: Thought content normal.        Judgment: Judgment normal.      Assessment and Plan   1. Essential hypertension, benign - CBC - CMP14+EGFR - Lipid panel - diltiazem (CARDIZEM CD) 240 MG 24 hr capsule; TAKE 1 CAPSULE(240 MG) BY MOUTH TWICE DAILY  Dispense: 60 capsule; Refill: 0 - triamterene-hydrochlorothiazide (DYAZIDE) 37.5-25 MG capsule; Take 1 each (1 capsule total) by mouth daily.  Dispense: 30 capsule; Refill: 0  2. Hypertriglyceridemia  3. Gastroesophageal reflux disease without esophagitis - omeprazole (PRILOSEC) 20 MG capsule; TAKE 1 CAPSULE(20 MG) BY MOUTH DAILY  Dispense: 30 capsule; Refill: 50   htn- suboptimal.  Pt not taking medications as directed. Was running out, so was only taking 1/2 of his medications.  30 day supply given, reviewed with patient that patient needs labs in order to give 90-day supply on medications.  Patient in agreement with plan.  At end of visit patient mentioning has intermittent abdominal pain on the right flank.  Patient stating he thinks he might have a hernia.  It is not outpouching or having severe pain, fever, nausea, or vomiting/ diarrhea.  Patient is a has been going on for a "long time."  Patient would make an appointment to follow-up for physical and discuss it at that time.  Hypertriglyceridemia- cont to watch carbs and inc in exercising.  Pt to get labs to recheck.  GERD- stable.  Cont omeprazole.  F/u 48mofor cpe.

## 2020-09-21 LAB — CMP14+EGFR
ALT: 50 IU/L — ABNORMAL HIGH (ref 0–44)
AST: 28 IU/L (ref 0–40)
Albumin/Globulin Ratio: 2.2 (ref 1.2–2.2)
Albumin: 4.9 g/dL (ref 4.0–5.0)
Alkaline Phosphatase: 74 IU/L (ref 44–121)
BUN/Creatinine Ratio: 17 (ref 9–20)
BUN: 24 mg/dL (ref 6–24)
Bilirubin Total: 0.5 mg/dL (ref 0.0–1.2)
CO2: 25 mmol/L (ref 20–29)
Calcium: 10 mg/dL (ref 8.7–10.2)
Chloride: 98 mmol/L (ref 96–106)
Creatinine, Ser: 1.41 mg/dL — ABNORMAL HIGH (ref 0.76–1.27)
GFR calc Af Amer: 68 mL/min/{1.73_m2} (ref >59)
GFR calc non Af Amer: 59 mL/min/{1.73_m2} — ABNORMAL LOW (ref >59)
Globulin, Total: 2.2 g/dL (ref 1.5–4.5)
Glucose: 81 mg/dL (ref 65–99)
Potassium: 4.4 mmol/L (ref 3.5–5.2)
Sodium: 142 mmol/L (ref 134–144)
Total Protein: 7.1 g/dL (ref 6.0–8.5)

## 2020-09-21 LAB — LIPID PANEL
Chol/HDL Ratio: 4 ratio (ref 0.0–5.0)
Cholesterol, Total: 182 mg/dL (ref 100–199)
HDL: 46 mg/dL (ref >39)
LDL Chol Calc (NIH): 113 mg/dL — ABNORMAL HIGH (ref 0–99)
Triglycerides: 128 mg/dL (ref 0–149)
VLDL Cholesterol Cal: 23 mg/dL (ref 5–40)

## 2020-09-21 LAB — CBC
Hematocrit: 48.7 % (ref 37.5–51.0)
Hemoglobin: 17.3 g/dL (ref 13.0–17.7)
MCH: 31.6 pg (ref 26.6–33.0)
MCHC: 35.5 g/dL (ref 31.5–35.7)
MCV: 89 fL (ref 79–97)
Platelets: 242 10*3/uL (ref 150–450)
RBC: 5.48 x10E6/uL (ref 4.14–5.80)
RDW: 13.1 % (ref 11.6–15.4)
WBC: 10.3 10*3/uL (ref 3.4–10.8)

## 2020-10-06 ENCOUNTER — Encounter: Payer: Self-pay | Admitting: Family Medicine

## 2020-10-06 ENCOUNTER — Other Ambulatory Visit: Payer: Self-pay | Admitting: Family Medicine

## 2020-10-06 DIAGNOSIS — I10 Essential (primary) hypertension: Secondary | ICD-10-CM

## 2020-10-19 ENCOUNTER — Encounter: Payer: Self-pay | Admitting: Family Medicine

## 2020-10-19 ENCOUNTER — Other Ambulatory Visit: Payer: Self-pay

## 2020-10-19 ENCOUNTER — Ambulatory Visit: Payer: BLUE CROSS/BLUE SHIELD | Admitting: Family Medicine

## 2020-10-19 VITALS — BP 126/82 | HR 82 | Temp 98.0°F | Ht 67.5 in | Wt 213.0 lb

## 2020-10-19 DIAGNOSIS — R7989 Other specified abnormal findings of blood chemistry: Secondary | ICD-10-CM

## 2020-10-19 DIAGNOSIS — R109 Unspecified abdominal pain: Secondary | ICD-10-CM

## 2020-10-19 DIAGNOSIS — I10 Essential (primary) hypertension: Secondary | ICD-10-CM | POA: Diagnosis not present

## 2020-10-19 MED ORDER — DILTIAZEM HCL ER COATED BEADS 240 MG PO CP24: ORAL_CAPSULE | ORAL | 1 refills | Status: DC

## 2020-10-19 MED ORDER — TRIAMTERENE-HCTZ 37.5-25 MG PO CAPS: 1.0000 | ORAL_CAPSULE | Freq: Every day | ORAL | 1 refills | Status: DC

## 2020-10-19 NOTE — Progress Notes (Signed)
Patient ID: William Roman, male    DOB: Jan 09, 1971, 50 y.o.   MRN: [id number]   Chief Complaint  Patient presents with  . Hypertension    Discuss recent labs   Subjective:    HPI F/u Htn- last visit bp 152/92.  HTN Pt running out of medications, and was only taking 1/2 tablet bc was about to run out. No SEs Denies chest pain, sob, LE swelling, or blurry vision.  Rt flank pain- Pt also noticing some Rt flank pain. If lifting or pushing dull achiness and resolves.  No meds for it. No pain with urination or blood in urine.  No blood or black stool. Can't find the pain with pushing on the area.  But feeling it with bending over or twisting.  Alcohol intake -once every 2 months, mixed drink 1-2.  Medical History William Roman has a past medical history of Allergic rhinitis, Anxiety, Atrial fibrillation ([address]), Depression, Elevated glucose, Hypertension, Reflux, and Stress.   Outpatient Encounter Medications as of 10/19/2020  Medication Sig  . acyclovir (ZOVIRAX) 400 MG tablet TAKE 1 TABLET(400 MG) BY MOUTH TWICE DAILY  . diclofenac (VOLTAREN) 75 MG EC tablet Take 1 tablet (75 mg total) by mouth 2 (two) times daily.  Marland Kitchen diltiazem (CARDIZEM CD) 240 MG 24 hr capsule TAKE 1 CAPSULE(240 MG) BY MOUTH TWICE DAILY  . Multiple Vitamins-Minerals (MULTIVITAMIN WITH MINERALS) tablet Take 1 tablet by mouth daily.  Marland Kitchen omeprazole (PRILOSEC) 20 MG capsule TAKE 1 CAPSULE(20 MG) BY MOUTH DAILY  . triamterene-hydrochlorothiazide (DYAZIDE) 37.5-25 MG capsule Take 1 each (1 capsule total) by mouth daily.  . [DISCONTINUED] diltiazem (CARDIZEM CD) 240 MG 24 hr capsule TAKE 1 CAPSULE(240 MG) BY MOUTH TWICE DAILY  . [DISCONTINUED] triamterene-hydrochlorothiazide (DYAZIDE) 37.5-25 MG capsule Take 1 each (1 capsule total) by mouth daily.   No facility-administered encounter medications on file as of 10/19/2020.     Review of Systems  Constitutional: Negative for chills and fever.  HENT: Negative for  congestion, rhinorrhea and sore throat.   Respiratory: Negative for cough, shortness of breath and wheezing.   Cardiovascular: Negative for chest pain and leg swelling.  Gastrointestinal: Negative for abdominal pain, blood in stool, diarrhea, nausea and vomiting.  Genitourinary: Positive for flank pain (right). Negative for difficulty urinating, dysuria, frequency, hematuria and urgency.  Musculoskeletal: Negative for back pain.  Skin: Negative for rash.  Neurological: Negative for dizziness, weakness and headaches.     Vitals BP 126/82   Pulse 82   Temp 98 F (36.7 C) (Oral)   Ht 5' 7.5" (1.715 m)   Wt 213 lb (96.6 kg)   SpO2 98%   BMI 32.87 kg/m   Objective:   Physical Exam Vitals and nursing note reviewed.  Constitutional:      General: He is not in acute distress.    Appearance: Normal appearance. He is not ill-appearing.  HENT:     Head: Normocephalic.     Nose: Nose normal. No congestion.     Mouth/Throat:     Mouth: Mucous membranes are moist.     Pharynx: No oropharyngeal exudate.  Eyes:     Extraocular Movements: Extraocular movements intact.     Conjunctiva/sclera: Conjunctivae normal.     Pupils: Pupils are equal, round, and reactive to light.  Cardiovascular:     Rate and Rhythm: Normal rate and regular rhythm.     Pulses: Normal pulses.     Heart sounds: Normal heart sounds. No murmur heard.   Pulmonary:  Effort: Pulmonary effort is normal.     Breath sounds: Normal breath sounds. No wheezing, rhonchi or rales.  Abdominal:     General: Bowel sounds are normal. There is no distension.     Palpations: Abdomen is soft. There is no mass.     Tenderness: There is no abdominal tenderness. There is no guarding or rebound.     Hernia: No hernia is present.  Musculoskeletal:        General: Normal range of motion.     Right lower leg: No edema.     Left lower leg: No edema.  Skin:    General: Skin is warm and dry.     Findings: No rash.   Neurological:     General: No focal deficit present.     Mental Status: He is alert and oriented to person, place, and time.     Cranial Nerves: No cranial nerve deficit.  Psychiatric:        Mood and Affect: Mood normal.        Behavior: Behavior normal.        Thought Content: Thought content normal.        Judgment: Judgment normal.      Assessment and Plan   1. Essential hypertension, benign - diltiazem (CARDIZEM CD) 240 MG 24 hr capsule; TAKE 1 CAPSULE(240 MG) BY MOUTH TWICE DAILY  Dispense: 180 capsule; Refill: 1 - triamterene-hydrochlorothiazide (DYAZIDE) 37.5-25 MG capsule; Take 1 each (1 capsule total) by mouth daily.  Dispense: 90 capsule; Refill: 1  2. Right flank pain  3. Elevated LFTs  4. Elevated serum creatinine   htn- uncontrolled. Pt not on meds today. Pt was off the medication and was only on half his meds.  Will restart meds. And discussed with pt to call if not able to get appt to get meds to cover till his appt.  Pt voiced understanding.  Rt flank pain- intermittent. Pt wanting to wait and recheck if getting worse. Declining ultrasound at this time. Did labs yearly and then 6 mo recheck.  Elevated LFTs and Cr- will cont to monitor and recheck on next visit.  Cr elevated at 1.4.  Elevated ALT 50 Slight inc in LDL 113. Cholesterol 182, HDL 46. Normal -cbc Glucose normal.   F/u 26mo.

## 2020-12-12 DIAGNOSIS — M2550 Pain in unspecified joint: Secondary | ICD-10-CM | POA: Diagnosis not present

## 2020-12-12 DIAGNOSIS — M109 Gout, unspecified: Secondary | ICD-10-CM | POA: Diagnosis not present

## 2020-12-12 DIAGNOSIS — M150 Primary generalized (osteo)arthritis: Secondary | ICD-10-CM | POA: Diagnosis not present

## 2020-12-12 DIAGNOSIS — M6530 Trigger finger, unspecified finger: Secondary | ICD-10-CM | POA: Diagnosis not present

## 2020-12-12 DIAGNOSIS — R768 Other specified abnormal immunological findings in serum: Secondary | ICD-10-CM | POA: Diagnosis not present

## 2021-02-12 DIAGNOSIS — M109 Gout, unspecified: Secondary | ICD-10-CM | POA: Diagnosis not present

## 2021-02-12 DIAGNOSIS — M6530 Trigger finger, unspecified finger: Secondary | ICD-10-CM | POA: Diagnosis not present

## 2021-02-12 DIAGNOSIS — R768 Other specified abnormal immunological findings in serum: Secondary | ICD-10-CM | POA: Diagnosis not present

## 2021-02-12 DIAGNOSIS — M150 Primary generalized (osteo)arthritis: Secondary | ICD-10-CM | POA: Diagnosis not present

## 2021-02-12 DIAGNOSIS — M2550 Pain in unspecified joint: Secondary | ICD-10-CM | POA: Diagnosis not present

## 2021-03-04 DIAGNOSIS — M2550 Pain in unspecified joint: Secondary | ICD-10-CM | POA: Diagnosis not present

## 2021-04-16 ENCOUNTER — Other Ambulatory Visit: Payer: Self-pay | Admitting: Family Medicine

## 2021-04-16 DIAGNOSIS — I10 Essential (primary) hypertension: Secondary | ICD-10-CM

## 2021-04-18 ENCOUNTER — Ambulatory Visit: Payer: BLUE CROSS/BLUE SHIELD | Admitting: Family Medicine

## 2021-05-01 ENCOUNTER — Ambulatory Visit: Payer: BLUE CROSS/BLUE SHIELD | Admitting: Family Medicine

## 2021-05-16 ENCOUNTER — Encounter: Payer: Self-pay | Admitting: Family Medicine

## 2021-05-16 ENCOUNTER — Other Ambulatory Visit: Payer: Self-pay

## 2021-05-16 ENCOUNTER — Ambulatory Visit: Payer: BLUE CROSS/BLUE SHIELD | Admitting: Family Medicine

## 2021-05-16 ENCOUNTER — Other Ambulatory Visit: Payer: Self-pay | Admitting: Family Medicine

## 2021-05-16 VITALS — BP 143/93 | HR 91 | Temp 98.4°F | Ht 67.5 in | Wt 208.0 lb

## 2021-05-16 DIAGNOSIS — D234 Other benign neoplasm of skin of scalp and neck: Secondary | ICD-10-CM | POA: Diagnosis not present

## 2021-05-16 DIAGNOSIS — R7989 Other specified abnormal findings of blood chemistry: Secondary | ICD-10-CM

## 2021-05-16 DIAGNOSIS — I10 Essential (primary) hypertension: Secondary | ICD-10-CM

## 2021-05-16 DIAGNOSIS — I499 Cardiac arrhythmia, unspecified: Secondary | ICD-10-CM

## 2021-05-16 MED ORDER — TRIAMTERENE-HCTZ 37.5-25 MG PO CAPS: 1.0000 | ORAL_CAPSULE | Freq: Every day | ORAL | 0 refills | Status: DC

## 2021-05-16 MED ORDER — DILTIAZEM HCL ER COATED BEADS 240 MG PO CP24: ORAL_CAPSULE | ORAL | 0 refills | Status: DC

## 2021-05-16 NOTE — Progress Notes (Signed)
 Patient ID: William Roman, male    DOB: 04/09/1971, 50 y.o.   MRN: [id number]   Chief Complaint  Patient presents with   Hypertension   Subjective:    HPI Lump on scalp- Has cyst on scalp on back of head. Bothering him when wearing hard hat at work. Needing to see derm. Has had them for 1 yr, one is bothering him the most. No drainage or infection.  HTN Pt compliant with BP meds.  No SEs Denies chest pain, sob, LE swelling, or blurry vision.  Taking 240mg diltiazem CD bid.  Stating has been on this dose for years. Also taking triamterene-hctz daily.   Doesn't check bp at home.    Had afib a "couple times" saw cardiology.  Pt stating it was related to drinking sodas.  Pt not on blood thinner.  No EKG in chart.   Recommending f/u with cards to recheck bp and since not controlled on current regimen.   Medical History Kiam has a past medical history of Allergic rhinitis, Anxiety, Atrial fibrillation ([facility]), Depression, Elevated glucose, Hypertension, Reflux, and Stress.   Outpatient Encounter Medications as of 05/16/2021  Medication Sig   allopurinol (ZYLOPRIM) 300 MG tablet Take 300 mg by mouth daily.   [DISCONTINUED] diltiazem (CARDIZEM CD) 240 MG 24 hr capsule TAKE 1 CAPSULE(240 MG) BY MOUTH TWICE DAILY   [DISCONTINUED] pseudoephedrine (SUDAFED 12 HOUR) 120 MG 12 hr tablet Take 120 mg by mouth 2 (two) times daily.   [DISCONTINUED] triamterene-hydrochlorothiazide (DYAZIDE) 37.5-25 MG capsule TAKE 1 CAPSULE BY MOUTH DAILY   acyclovir (ZOVIRAX) 400 MG tablet TAKE 1 TABLET(400 MG) BY MOUTH TWICE DAILY (Patient not taking: Reported on 05/16/2021)   diltiazem (CARDIZEM CD) 240 MG 24 hr capsule TAKE 1 CAPSULE(240 MG) BY MOUTH TWICE DAILY   Multiple Vitamins-Minerals (MULTIVITAMIN WITH MINERALS) tablet Take 1 tablet by mouth daily. (Patient not taking: Reported on 05/16/2021)   triamterene-hydrochlorothiazide (DYAZIDE) 37.5-25 MG capsule Take 1 each (1 capsule total) by mouth  daily.   [DISCONTINUED] Colchicine 0.6 MG CAPS Take 1 capsule by mouth 2 (two) times daily. (Patient not taking: Reported on 05/16/2021)   [DISCONTINUED] diclofenac (VOLTAREN) 75 MG EC tablet Take 1 tablet (75 mg total) by mouth 2 (two) times daily. (Patient not taking: Reported on 05/16/2021)   [DISCONTINUED] omeprazole (PRILOSEC) 20 MG capsule TAKE 1 CAPSULE(20 MG) BY MOUTH DAILY (Patient not taking: Reported on 05/16/2021)   No facility-administered encounter medications on file as of 05/16/2021.     Review of Systems  Constitutional:  Negative for chills and fever.  HENT:  Negative for congestion, rhinorrhea and sore throat.   Respiratory:  Negative for cough, shortness of breath and wheezing.   Cardiovascular:  Negative for chest pain and leg swelling.  Gastrointestinal:  Negative for abdominal pain, diarrhea, nausea and vomiting.  Genitourinary:  Negative for dysuria and frequency.  Skin:  Negative for rash.       Cyst on back of head  Neurological:  Negative for dizziness, weakness and headaches.    Vitals BP (!) 143/93   Pulse 91   Temp 98.4 F (36.9 C)   Ht 5' 7.5" (1.715 m)   Wt 208 lb (94.3 kg)   SpO2 98%   BMI 32.10 kg/m   Objective:   Physical Exam Vitals and nursing note reviewed.  Constitutional:      General: He is not in acute distress.    Appearance: Normal appearance. He is not ill-appearing.  HENT:       Head: Normocephalic.     Comments: +0.75cm mobile cyst on left posterior scalp.  No tenderness or erythema or rash. No drainage or fluctuance.    Nose: Nose normal. No congestion.     Mouth/Throat:     Mouth: Mucous membranes are moist.     Pharynx: No oropharyngeal exudate.  Eyes:     Extraocular Movements: Extraocular movements intact.     Conjunctiva/sclera: Conjunctivae normal.     Pupils: Pupils are equal, round, and reactive to light.  Cardiovascular:     Rate and Rhythm: Normal rate and regular rhythm.     Pulses: Normal pulses.     Heart sounds:  Normal heart sounds. No murmur heard. Pulmonary:     Effort: Pulmonary effort is normal.     Breath sounds: Normal breath sounds. No wheezing, rhonchi or rales.  Musculoskeletal:        General: Normal range of motion.     Right lower leg: No edema.     Left lower leg: No edema.  Skin:    General: Skin is warm and dry.     Findings: No rash.  Neurological:     General: No focal deficit present.     Mental Status: He is alert and oriented to person, place, and time.     Cranial Nerves: No cranial nerve deficit.  Psychiatric:        Mood and Affect: Mood normal.        Behavior: Behavior normal.        Thought Content: Thought content normal.        Judgment: Judgment normal.     Assessment and Plan   1. Uncontrolled hypertension - Ambulatory referral to Cardiology - CMP14+EGFR; Future - CMP14+EGFR - triamterene-hydrochlorothiazide (DYAZIDE) 37.5-25 MG capsule; Take 1 each (1 capsule total) by mouth daily.  Dispense: 30 capsule; Refill: 0 - diltiazem (CARDIZEM CD) 240 MG 24 hr capsule; TAKE 1 CAPSULE(240 MG) BY MOUTH TWICE DAILY  Dispense: 60 capsule; Refill: 0  2. Dermoid cyst of scalp - Ambulatory referral to Dermatology  3. Irregular heart rate - Ambulatory referral to Cardiology  4. Elevated serum creatinine   Htn- not controlled.  Pt on cardizem 240 cd bid and triamterene-hctz. Pt has been taking sudafed. Advised pt to stop taking this due to elevation in blood pressure.   Reviewed with pt not to take sudafed for sinuses.  Advising can take coricidin hbp and use saline for nasal congestion.  Can take claritin. Also to decrease salt.  Gave script for bp cuff. Check at home if over 140/85 or above to call the office.   -pt has reaction to lisinopril and losartan.  Will refer to cards to have additional evaluation of the uncontrolled bp.  Cr elevated- will recheck today.  Pt to f/u in 3mo for recheck of bp.  Cyst on scalp- referral to derm.   H/o irregular HR- in  the past, no new concerns.  Will refer to cardiology for f/u on htn.   Will refill meds after seeing labs.  Return in about 3 months (around 08/15/2021) for f/u htn.  Addendum- Cr improved from 1.41 to 1.33.  Elevated ALT, slightly, cont to monitor. Glulcose at 119, non-fasting. 

## 2021-05-16 NOTE — Patient Instructions (Signed)
Check bp at home if seeing numbers 145/85 or above 3x then call the office.  Check after had medications at least 2 hrs in system.

## 2021-05-17 LAB — CMP14+EGFR
ALT: 49 IU/L — ABNORMAL HIGH (ref 0–44)
AST: 32 IU/L (ref 0–40)
Albumin/Globulin Ratio: 2.1 (ref 1.2–2.2)
Albumin: 4.5 g/dL (ref 4.0–5.0)
Alkaline Phosphatase: 66 IU/L (ref 44–121)
BUN/Creatinine Ratio: 13 (ref 9–20)
BUN: 17 mg/dL (ref 6–24)
Bilirubin Total: 0.5 mg/dL (ref 0.0–1.2)
CO2: 23 mmol/L (ref 20–29)
Calcium: 9.4 mg/dL (ref 8.7–10.2)
Chloride: 98 mmol/L (ref 96–106)
Creatinine, Ser: 1.33 mg/dL — ABNORMAL HIGH (ref 0.76–1.27)
Globulin, Total: 2.1 g/dL (ref 1.5–4.5)
Glucose: 119 mg/dL — ABNORMAL HIGH (ref 65–99)
Potassium: 4.2 mmol/L (ref 3.5–5.2)
Sodium: 139 mmol/L (ref 134–144)
Total Protein: 6.6 g/dL (ref 6.0–8.5)
eGFR: 66 mL/min/{1.73_m2} (ref >59)

## 2021-05-25 MED ORDER — DILTIAZEM HCL ER COATED BEADS 240 MG PO CP24: ORAL_CAPSULE | ORAL | 1 refills | Status: DC

## 2021-05-25 MED ORDER — TRIAMTERENE-HCTZ 37.5-25 MG PO CAPS: 1.0000 | ORAL_CAPSULE | Freq: Every day | ORAL | 0 refills | Status: DC

## 2021-05-27 ENCOUNTER — Other Ambulatory Visit: Payer: Self-pay | Admitting: Family Medicine

## 2021-05-27 DIAGNOSIS — I10 Essential (primary) hypertension: Secondary | ICD-10-CM

## 2021-05-29 ENCOUNTER — Encounter: Payer: Self-pay | Admitting: Family Medicine

## 2021-06-28 DIAGNOSIS — L7211 Pilar cyst: Secondary | ICD-10-CM | POA: Diagnosis not present

## 2021-06-28 DIAGNOSIS — B078 Other viral warts: Secondary | ICD-10-CM | POA: Diagnosis not present

## 2021-07-08 ENCOUNTER — Telehealth: Payer: Self-pay | Admitting: Internal Medicine

## 2021-07-08 ENCOUNTER — Encounter (HOSPITAL_COMMUNITY): Payer: BLUE CROSS/BLUE SHIELD

## 2021-07-08 ENCOUNTER — Other Ambulatory Visit: Payer: Self-pay

## 2021-07-08 ENCOUNTER — Ambulatory Visit (INDEPENDENT_AMBULATORY_CARE_PROVIDER_SITE_OTHER): Payer: BLUE CROSS/BLUE SHIELD | Admitting: Internal Medicine

## 2021-07-08 ENCOUNTER — Encounter: Payer: Self-pay | Admitting: Internal Medicine

## 2021-07-08 VITALS — BP 158/100 | HR 77 | Ht 67.0 in | Wt 216.0 lb

## 2021-07-08 DIAGNOSIS — I10 Essential (primary) hypertension: Secondary | ICD-10-CM

## 2021-07-08 DIAGNOSIS — I701 Atherosclerosis of renal artery: Secondary | ICD-10-CM | POA: Diagnosis not present

## 2021-07-08 MED ORDER — METOPROLOL SUCCINATE ER 50 MG PO TB24: 50.0000 mg | ORAL_TABLET | Freq: Two times a day (BID) | ORAL | 3 refills | Status: DC

## 2021-07-08 MED ORDER — AMLODIPINE BESYLATE 5 MG PO TABS: 5.0000 mg | ORAL_TABLET | Freq: Two times a day (BID) | ORAL | 3 refills | Status: DC

## 2021-07-08 NOTE — Patient Instructions (Addendum)
Medication Instructions:   STOP Diltiazem  Start Toprol XL 50 mg Two Times Daily  Start Norvasc 5 mg Two Times Daily   Start Taking your blood pressure at home.   *If you need a refill on your cardiac medications before your next appointment, please call your pharmacy*   Lab Work: NONE   If you have labs (blood work) drawn today and your tests are completely normal, you will receive your results only by: [facility] (if you have MyChart) OR A paper copy in the mail If you have any lab test that is abnormal or we need to change your treatment, we will call you to review the results.   Testing/Procedures: Your physician has requested that you have a renal artery duplex. During this test, an ultrasound is used to evaluate blood flow to the kidneys. Allow one hour for this exam. Do not eat after midnight the day before and avoid carbonated beverages. Take your medications as you usually do.    Follow-Up: At [facility], you and your health needs are our priority.  As part of our continuing mission to provide you with exceptional heart care, we have created designated Provider Care Teams.  These Care Teams include your primary Cardiologist (physician) and Advanced Practice Providers (APPs -  Physician Assistants and Nurse Practitioners) who all work together to provide you with the care you need, when you need it.  We recommend signing up for the patient portal called "MyChart".  Sign up information is provided on this After Visit Summary.  MyChart is used to connect with patients for Virtual Visits (Telemedicine).  Patients are able to view lab/test results, encounter notes, upcoming appointments, etc.  Non-urgent messages can be sent to your provider as well.   To learn more about what you can do with MyChart, go to [email or web address].    Your next appointment:   1 month(s)  The format for your next appointment:   In Person  Provider:   Dorris Carnes, MD   Other  Instructions Thank you for choosing [facility]!

## 2021-07-08 NOTE — Telephone Encounter (Signed)
PERCERT FOR : RENAL US   [facility] - [facility]  07-15-2021

## 2021-07-08 NOTE — Progress Notes (Signed)
Cardiology Office Note   Date:  07/08/2021   ID:  Vanden, Fawaz [birth year], MRN [id number]  PCP:  Erven Colla, DO  Cardiologist:   Dorris Carnes, MD   Pt referred for evaluation of HTN      History of Present Illness: William Roman is a 50 y.o. male with a history of HTN and PAF    He was seen by Geni Bers in clinic recently    When seen by Dr Lovena Le BP was high   He was on cardiazem and Maxzide   Did not tolerate lisinopril or losartan  in the past   The pt says his  BP was better last winter   HIgher now   Today is the highest he has seen it       The pt Denies CP   Breathing is OK    Moves around at work     No palpitaitons   Regarding afib.   Only episode was back in 2003    He was very symptomatic   Attrib to caffeine  None since       Snores occasionally, people do not say he stops breathing     No outpatient medications have been marked as taking for the 07/08/21 encounter (Office Visit) with Fay Records, MD.     Allergies:   Lisinopril, Losartan, and Wellbutrin [bupropion]   Past Medical History:  Diagnosis Date   Allergic rhinitis    Anxiety    Atrial fibrillation ([place])    x3   Depression    Elevated glucose    Hypertension    Reflux    Stress     Past Surgical History:  Procedure Laterality Date   HERNIA REPAIR       Social History:  The patient  reports that he has never smoked. He has never used smokeless tobacco. He reports that he does not drink alcohol and does not use drugs.   Family History:  The patient's family history includes Diabetes in his father.    ROS:  Please see the history of present illness. All other systems are reviewed and  Negative to the above problem except as noted.    PHYSICAL EXAM: VS:  BP (!) 158/100 (BP Location: Right Arm)   Pulse 77   Ht 5\' 7"  (1.702 m)   Wt 216 lb (98 kg)   SpO2 98%   BMI 33.83 kg/m   GEN: OBese 50 yo  in no acute distress  HEENT: normal  Neck: no JVD, carotid  bruits, or masses Cardiac: RRR; no murmurs, rubs, or gallops,no edema  Respiratory:  clear to auscultation bilaterally, normal work of breathing GI: soft, nontender, nondistended, + BS  No hepatomegaly  MS: no deformity Moving all extremities   Skin: warm and dry, no rash Neuro:  Strength and sensation are intact Psych: euthymic mood, full affect   EKG:  EKG is ordered today.  SR 77 bpm  Nonspecific ST change e   Lipid Panel    Component Value Date/Time   CHOL 182 09/20/2020 1128   TRIG 128 09/20/2020 1128   HDL 46 09/20/2020 1128   CHOLHDL 4.0 09/20/2020 1128   CHOLHDL 5.2 04/18/2014 0931   VLDL 32 04/18/2014 0931   LDLCALC 113 (H) 09/20/2020 1128      Wt Readings from Last 3 Encounters:  07/08/21 216 lb (98 kg)  05/16/21 208 lb (94.3 kg)  10/19/20 213 lb (96.6 kg)  ASSESSMENT AND PLAN:  1  HTN   PT has been followed for HTN for awhile   Was under control until now   Now high Reviw of meds I would recomm:   Stopping Cardiazem   Start Toprol XL 50 bid   ALso start amlodipine 5 bid WIll get renal artery USN  to r/o RAS Follow up in 1 month  2  Hx afib   Remote   One symtpomatic spell        Current medicines are reviewed at length with the patient today.  The patient does not have concerns regarding medicines.  Signed, Dorris Carnes, MD  07/08/2021 9:38 AM    [address], [place], [place]  [address] Phone: [phone]; Fax: [phone]

## 2021-07-09 DIAGNOSIS — M109 Gout, unspecified: Secondary | ICD-10-CM | POA: Diagnosis not present

## 2021-07-09 DIAGNOSIS — M6530 Trigger finger, unspecified finger: Secondary | ICD-10-CM | POA: Diagnosis not present

## 2021-07-09 DIAGNOSIS — M150 Primary generalized (osteo)arthritis: Secondary | ICD-10-CM | POA: Diagnosis not present

## 2021-07-09 DIAGNOSIS — M2550 Pain in unspecified joint: Secondary | ICD-10-CM | POA: Diagnosis not present

## 2021-07-15 ENCOUNTER — Other Ambulatory Visit: Payer: Self-pay

## 2021-07-15 ENCOUNTER — Ambulatory Visit (HOSPITAL_COMMUNITY)
Admission: RE | Admit: 2021-07-15 | Discharge: 2021-07-15 | Disposition: A | Discharge status: Home / Self Care | Payer: BLUE CROSS/BLUE SHIELD | Source: Ambulatory Visit | Attending: Internal Medicine | Admitting: Internal Medicine

## 2021-07-15 DIAGNOSIS — I701 Atherosclerosis of renal artery: Secondary | ICD-10-CM

## 2021-07-15 DIAGNOSIS — I10 Essential (primary) hypertension: Secondary | ICD-10-CM | POA: Diagnosis not present

## 2021-07-15 NOTE — Progress Notes (Signed)
Renal artery duplex completed. Refer to "CV Proc" under chart review to view preliminary results.  07/15/2021 8:39 AM Kelby Aline., MHA, RVT, RDCS, RDMS

## 2021-07-16 NOTE — Addendum Note (Signed)
Addended by: Levonne Hubert on: 07/16/2021 11:04 AM   Modules accepted: Orders

## 2021-08-06 ENCOUNTER — Ambulatory Visit: Payer: BLUE CROSS/BLUE SHIELD | Admitting: Student

## 2021-08-21 ENCOUNTER — Ambulatory Visit: Payer: BLUE CROSS/BLUE SHIELD | Admitting: Dietician

## 2021-09-04 ENCOUNTER — Other Ambulatory Visit: Payer: Self-pay

## 2021-09-04 ENCOUNTER — Ambulatory Visit: Payer: BC Managed Care – PPO | Admitting: Family Medicine

## 2021-09-04 VITALS — BP 134/84 | HR 75 | Temp 98.5°F | Ht 67.0 in | Wt 216.0 lb

## 2021-09-04 DIAGNOSIS — K219 Gastro-esophageal reflux disease without esophagitis: Secondary | ICD-10-CM | POA: Insufficient documentation

## 2021-09-04 DIAGNOSIS — M109 Gout, unspecified: Secondary | ICD-10-CM | POA: Insufficient documentation

## 2021-09-04 DIAGNOSIS — F32A Depression, unspecified: Secondary | ICD-10-CM | POA: Diagnosis not present

## 2021-09-04 DIAGNOSIS — N183 Chronic kidney disease, stage 3 unspecified: Secondary | ICD-10-CM | POA: Insufficient documentation

## 2021-09-04 DIAGNOSIS — F419 Anxiety disorder, unspecified: Secondary | ICD-10-CM

## 2021-09-04 DIAGNOSIS — I10 Essential (primary) hypertension: Secondary | ICD-10-CM

## 2021-09-04 MED ORDER — PANTOPRAZOLE SODIUM 40 MG PO TBEC: 40.0000 mg | DELAYED_RELEASE_TABLET | Freq: Every day | ORAL | 1 refills | Status: DC

## 2021-09-04 MED ORDER — SERTRALINE HCL 50 MG PO TABS: 50.0000 mg | ORAL_TABLET | Freq: Every day | ORAL | 1 refills | Status: DC

## 2021-09-04 NOTE — Assessment & Plan Note (Signed)
Stable.  Continue current medications.

## 2021-09-04 NOTE — Assessment & Plan Note (Signed)
Starting on Protonix. 

## 2021-09-04 NOTE — Progress Notes (Signed)
Subjective:  Patient ID: William Roman, male    DOB: 19-Apr-1971  Age: 50 y.o. MRN: [id number]  CC: Chief Complaint  Patient presents with   Hypertension   Anxiety    Depression    Gastroesophageal Reflux    Discuss medications    HPI:  50 year old male with hypertension, hypertriglyceridemia, gout, GERD presents for follow-up.  Patient's hypertension is markedly improved.  BP 138/84 today.  He is on amlodipine, triamterene HCTZ, and metoprolol.  Recent medication changes by cardiology.  Also has had a recent ultrasound of the renal arteries to rule out renal artery stenosis.  Study was normal.  He endorses compliance with medications.  He has noticed lower extremity edema with amlodipine but this resolved after restarting triamterene HCTZ.  Patient reports ongoing GERD.  He states that he takes Prilosec as needed.  Still having symptoms.  Would like prescription medication for reflux today.  Patient also states that he is having ongoing issues with his mood.  He feels very irritable.  Cries easily.  Feels very short tempered.  PHQ-9 score is 12.  GAD-7 score is 13.  He is having a lot of difficulty at this time.  Particularly in the workplace.  Patient Active Problem List   Diagnosis Date Noted   Gout 09/04/2021   GERD (gastroesophageal reflux disease) 09/04/2021   Anxiety and depression 09/04/2021   Essential hypertension, benign 08/15/2013   Hypertriglyceridemia 08/15/2013    Social Hx   Social History   Socioeconomic History   Marital status: Single    Spouse name: Not on file   Number of children: Not on file   Years of education: Not on file   Highest education level: Not on file  Occupational History   Not on file  Tobacco Use   Smoking status: Never   Smokeless tobacco: Never  Substance and Sexual Activity   Alcohol use: No   Drug use: No   Sexual activity: Not on file  Other Topics Concern   Not on file  Social History Narrative   Not on file    Social Determinants of Health   Financial Resource Strain: Not on file  Food Insecurity: Not on file  Transportation Needs: Not on file  Physical Activity: Not on file  Stress: Not on file  Social Connections: Not on file    Review of Systems Per HPI  Objective:  BP 134/84    Pulse 75    Temp 98.5 F (36.9 C)    Ht 5\' 7"  (1.702 m)    Wt 216 lb (98 kg)    SpO2 98%    BMI 33.83 kg/m   BP/Weight 09/04/2021 22/10/5425 0/02/2375  Systolic BP 283 151 761  Diastolic BP 84 607 93  Wt. (Lbs) 216 216 208  BMI 33.83 33.83 32.1    Physical Exam Constitutional:      General: He is not in acute distress.    Appearance: Normal appearance.  Eyes:     General:        Right eye: No discharge.     Conjunctiva/sclera: Conjunctivae normal.  Cardiovascular:     Rate and Rhythm: Normal rate and regular rhythm.  Pulmonary:     Effort: Pulmonary effort is normal.     Breath sounds: Normal breath sounds. No wheezing, rhonchi or rales.  Neurological:     Mental Status: He is alert.  Psychiatric:        Mood and Affect: Mood normal.  Behavior: Behavior normal.    Lab Results  Component Value Date   WBC 10.3 09/20/2020   HGB 17.3 09/20/2020   HCT 48.7 09/20/2020   PLT 242 09/20/2020   GLUCOSE 119 (H) 05/16/2021   CHOL 182 09/20/2020   TRIG 128 09/20/2020   HDL 46 09/20/2020   LDLCALC 113 (H) 09/20/2020   ALT 49 (H) 05/16/2021   AST 32 05/16/2021   NA 139 05/16/2021   K 4.2 05/16/2021   CL 98 05/16/2021   CREATININE 1.33 (H) 05/16/2021   BUN 17 05/16/2021   CO2 23 05/16/2021     Assessment & Plan:   Problem List Items Addressed This Visit       Cardiovascular and Mediastinum   Essential hypertension, benign - Primary    Stable.  Continue current medications.        Digestive   GERD (gastroesophageal reflux disease)    Starting on Protonix.      Relevant Medications   pantoprazole (PROTONIX) 40 MG tablet     Other   Anxiety and depression    New  problem.  Starting on Zoloft.  Follow-up in 6 weeks.      Relevant Medications   sertraline (ZOLOFT) 50 MG tablet    Meds ordered this encounter  Medications   pantoprazole (PROTONIX) 40 MG tablet    Sig: Take 1 tablet (40 mg total) by mouth daily.    Dispense:  90 tablet    Refill:  1   sertraline (ZOLOFT) 50 MG tablet    Sig: Take 1 tablet (50 mg total) by mouth daily.    Dispense:  90 tablet    Refill:  1    Follow-up:  Return in about 6 weeks (around 10/16/2021).  [place]

## 2021-09-04 NOTE — Assessment & Plan Note (Signed)
New problem.  Starting on Zoloft.  Follow-up in 6 weeks.

## 2021-09-04 NOTE — Patient Instructions (Signed)
Protonix as prescribed.  Start Zoloft.  May increase to 100 mg daily after 1-2 weeks.  Continue your current BP medication.  Follow up in 6 weeks.

## 2021-09-06 DIAGNOSIS — M109 Gout, unspecified: Secondary | ICD-10-CM | POA: Diagnosis not present

## 2021-09-06 DIAGNOSIS — M150 Primary generalized (osteo)arthritis: Secondary | ICD-10-CM | POA: Diagnosis not present

## 2021-09-06 DIAGNOSIS — M2550 Pain in unspecified joint: Secondary | ICD-10-CM | POA: Diagnosis not present

## 2021-09-06 DIAGNOSIS — M6530 Trigger finger, unspecified finger: Secondary | ICD-10-CM | POA: Diagnosis not present

## 2021-10-13 ENCOUNTER — Other Ambulatory Visit: Payer: Self-pay | Admitting: Family Medicine

## 2021-10-13 DIAGNOSIS — I10 Essential (primary) hypertension: Secondary | ICD-10-CM

## 2021-10-14 DIAGNOSIS — M109 Gout, unspecified: Secondary | ICD-10-CM | POA: Diagnosis not present

## 2021-10-14 DIAGNOSIS — M6530 Trigger finger, unspecified finger: Secondary | ICD-10-CM | POA: Diagnosis not present

## 2021-10-14 DIAGNOSIS — M2550 Pain in unspecified joint: Secondary | ICD-10-CM | POA: Diagnosis not present

## 2021-10-14 DIAGNOSIS — M150 Primary generalized (osteo)arthritis: Secondary | ICD-10-CM | POA: Diagnosis not present

## 2021-10-16 ENCOUNTER — Ambulatory Visit: Payer: BC Managed Care – PPO | Admitting: Family Medicine

## 2021-10-16 ENCOUNTER — Other Ambulatory Visit: Payer: Self-pay

## 2021-10-16 ENCOUNTER — Encounter: Payer: Self-pay | Admitting: Family Medicine

## 2021-10-16 VITALS — BP 130/82 | HR 71 | Temp 97.8°F | Ht 67.0 in | Wt 215.0 lb

## 2021-10-16 DIAGNOSIS — Z130 Encounter for screening for diseases of the blood and blood-forming organs and certain disorders involving the immune mechanism: Secondary | ICD-10-CM | POA: Diagnosis not present

## 2021-10-16 DIAGNOSIS — R739 Hyperglycemia, unspecified: Secondary | ICD-10-CM | POA: Diagnosis not present

## 2021-10-16 DIAGNOSIS — F419 Anxiety disorder, unspecified: Secondary | ICD-10-CM | POA: Diagnosis not present

## 2021-10-16 DIAGNOSIS — E781 Pure hyperglyceridemia: Secondary | ICD-10-CM

## 2021-10-16 DIAGNOSIS — M1A9XX0 Chronic gout, unspecified, without tophus (tophi): Secondary | ICD-10-CM | POA: Diagnosis not present

## 2021-10-16 DIAGNOSIS — I10 Essential (primary) hypertension: Secondary | ICD-10-CM | POA: Diagnosis not present

## 2021-10-16 DIAGNOSIS — F32A Depression, unspecified: Secondary | ICD-10-CM

## 2021-10-16 NOTE — Assessment & Plan Note (Signed)
BP well controlled.  Continue current meds.

## 2021-10-16 NOTE — Progress Notes (Signed)
Subjective:  Patient ID: William Roman, male    DOB: [birth year]  Age: 51 y.o. MRN: [id number]  CC: Chief Complaint  Patient presents with   6 week follow up     Zoloft     HPI:  51 year old male with gouty arthritis, GERD, anxiety and depression, hypertension presents for follow-up regarding anxiety and depression.  Patient seen by me in December.  Placed on Zoloft.  Patient reports that the medication has helped with his "outbursts" and crying episodes.  However, he notices decreased interest in doing things.  He is not particularly motivated.  He states that this seems to be worse since using medication.  He is otherwise feeling well.  He states that he does not feel depressed.  He would like to discuss this today.  Blood pressure currently well controlled.  He is on amlodipine, metoprolol, and triamterene HCTZ.  Additionally, patient has a lab request with him today from rheumatology.  Patient Active Problem List   Diagnosis Date Noted   Gout 09/04/2021   GERD (gastroesophageal reflux disease) 09/04/2021   Anxiety and depression 09/04/2021   Essential hypertension, benign 08/15/2013   Hypertriglyceridemia 08/15/2013    Social Hx   Social History   Socioeconomic History   Marital status: Single    Spouse name: Not on file   Number of children: Not on file   Years of education: Not on file   Highest education level: Not on file  Occupational History   Not on file  Tobacco Use   Smoking status: Never   Smokeless tobacco: Never  Substance and Sexual Activity   Alcohol use: No   Drug use: No   Sexual activity: Not on file  Other Topics Concern   Not on file  Social History Narrative   Not on file   Social Determinants of Health   Financial Resource Strain: Not on file  Food Insecurity: Not on file  Transportation Needs: Not on file  Physical Activity: Not on file  Stress: Not on file  Social Connections: Not on file    Review of Systems Per  HPI  Objective:  BP 130/82    Pulse 71    Temp 97.8 F (36.6 C)    Ht 5' 7"  (1.702 m)    Wt 215 lb (97.5 kg)    SpO2 97%    BMI 33.67 kg/m   BP/Weight 10/16/2021 09/04/2021 68/07/5725  Systolic BP 203 559 741  Diastolic BP 82 84 638  Wt. (Lbs) 215 216 216  BMI 33.67 33.83 33.83    Physical Exam Vitals and nursing note reviewed.  Constitutional:      General: He is not in acute distress.    Appearance: Normal appearance. He is not ill-appearing.  Eyes:     General:        Right eye: No discharge.        Left eye: No discharge.     Conjunctiva/sclera: Conjunctivae normal.  Cardiovascular:     Rate and Rhythm: Normal rate and regular rhythm.  Pulmonary:     Effort: Pulmonary effort is normal.     Breath sounds: Normal breath sounds. No wheezing, rhonchi or rales.  Neurological:     Mental Status: He is alert.  Psychiatric:        Mood and Affect: Mood normal.        Behavior: Behavior normal.    Lab Results  Component Value Date   WBC 10.3 09/20/2020   HGB 17.3  09/20/2020   HCT 48.7 09/20/2020   PLT 242 09/20/2020   GLUCOSE 119 (H) 05/16/2021   CHOL 182 09/20/2020   TRIG 128 09/20/2020   HDL 46 09/20/2020   LDLCALC 113 (H) 09/20/2020   ALT 49 (H) 05/16/2021   AST 32 05/16/2021   NA 139 05/16/2021   K 4.2 05/16/2021   CL 98 05/16/2021   CREATININE 1.33 (H) 05/16/2021   BUN 17 05/16/2021   CO2 23 05/16/2021     Assessment & Plan:   Problem List Items Addressed This Visit       Cardiovascular and Mediastinum   Essential hypertension, benign    BP well controlled.  Continue current meds.      Relevant Orders   CMP14+EGFR     Other   Hypertriglyceridemia   Relevant Orders   Lipid panel   Gout   Relevant Orders   Uric acid   Anxiety and depression - Primary    Some improvement.  Decreasing Zoloft to 50 mg daily.  Reevaluate in 3 months.      Other Visit Diagnoses     Screening for deficiency anemia       Relevant Orders   CBC   Blood  glucose elevated       Relevant Orders   Hemoglobin A1c       Follow-up:  Return in about 3 months (around 01/13/2022).  [place]

## 2021-10-16 NOTE — Patient Instructions (Signed)
Decrease Zoloft to 50 mg daily.  Continue your other medications.  Labs today.  Follow up in 3 months.  Take care  Dr. Lacinda Axon

## 2021-10-16 NOTE — Assessment & Plan Note (Signed)
Some improvement.  Decreasing Zoloft to 50 mg daily.  Reevaluate in 3 months.

## 2021-10-17 LAB — LIPID PANEL
Chol/HDL Ratio: 6.3 ratio — ABNORMAL HIGH (ref 0.0–5.0)
Cholesterol, Total: 196 mg/dL (ref 100–199)
HDL: 31 mg/dL — ABNORMAL LOW (ref >39)
LDL Chol Calc (NIH): 119 mg/dL — ABNORMAL HIGH (ref 0–99)
Triglycerides: 260 mg/dL — ABNORMAL HIGH (ref 0–149)
VLDL Cholesterol Cal: 46 mg/dL — ABNORMAL HIGH (ref 5–40)

## 2021-10-17 LAB — CBC
Hematocrit: 45.5 % (ref 37.5–51.0)
Hemoglobin: 16.4 g/dL (ref 13.0–17.7)
MCH: 31.4 pg (ref 26.6–33.0)
MCHC: 36 g/dL — ABNORMAL HIGH (ref 31.5–35.7)
MCV: 87 fL (ref 79–97)
Platelets: 214 10*3/uL (ref 150–450)
RBC: 5.22 x10E6/uL (ref 4.14–5.80)
RDW: 12.5 % (ref 11.6–15.4)
WBC: 7 10*3/uL (ref 3.4–10.8)

## 2021-10-17 LAB — CMP14+EGFR
ALT: 44 IU/L (ref 0–44)
AST: 32 IU/L (ref 0–40)
Albumin/Globulin Ratio: 2.2 (ref 1.2–2.2)
Albumin: 4.6 g/dL (ref 4.0–5.0)
Alkaline Phosphatase: 70 IU/L (ref 44–121)
BUN/Creatinine Ratio: 16 (ref 9–20)
BUN: 21 mg/dL (ref 6–24)
Bilirubin Total: 0.5 mg/dL (ref 0.0–1.2)
CO2: 27 mmol/L (ref 20–29)
Calcium: 10.1 mg/dL (ref 8.7–10.2)
Chloride: 102 mmol/L (ref 96–106)
Creatinine, Ser: 1.34 mg/dL — ABNORMAL HIGH (ref 0.76–1.27)
Globulin, Total: 2.1 g/dL (ref 1.5–4.5)
Glucose: 93 mg/dL (ref 70–99)
Potassium: 5.1 mmol/L (ref 3.5–5.2)
Sodium: 144 mmol/L (ref 134–144)
Total Protein: 6.7 g/dL (ref 6.0–8.5)
eGFR: 65 mL/min/{1.73_m2} (ref >59)

## 2021-10-17 LAB — HEMOGLOBIN A1C
Est. average glucose Bld gHb Est-mCnc: 103 mg/dL
Hgb A1c MFr Bld: 5.2 % (ref 4.8–5.6)

## 2021-10-17 LAB — URIC ACID: Uric Acid: 6.7 mg/dL (ref 3.8–8.4)

## 2021-10-29 DIAGNOSIS — M25532 Pain in left wrist: Secondary | ICD-10-CM | POA: Diagnosis not present

## 2021-10-29 DIAGNOSIS — M25531 Pain in right wrist: Secondary | ICD-10-CM | POA: Diagnosis not present

## 2021-10-29 DIAGNOSIS — M6530 Trigger finger, unspecified finger: Secondary | ICD-10-CM | POA: Diagnosis not present

## 2021-10-29 DIAGNOSIS — G5603 Carpal tunnel syndrome, bilateral upper limbs: Secondary | ICD-10-CM | POA: Diagnosis not present

## 2021-11-08 ENCOUNTER — Other Ambulatory Visit: Payer: Self-pay | Admitting: Family Medicine

## 2022-01-13 ENCOUNTER — Ambulatory Visit: Payer: BC Managed Care – PPO | Admitting: Family Medicine

## 2022-01-13 VITALS — BP 132/86 | HR 67 | Temp 97.9°F | Ht 67.0 in | Wt 222.2 lb

## 2022-01-13 DIAGNOSIS — I10 Essential (primary) hypertension: Secondary | ICD-10-CM

## 2022-01-13 DIAGNOSIS — E782 Mixed hyperlipidemia: Secondary | ICD-10-CM | POA: Insufficient documentation

## 2022-01-13 DIAGNOSIS — F419 Anxiety disorder, unspecified: Secondary | ICD-10-CM

## 2022-01-13 DIAGNOSIS — N182 Chronic kidney disease, stage 2 (mild): Secondary | ICD-10-CM | POA: Insufficient documentation

## 2022-01-13 DIAGNOSIS — F32A Depression, unspecified: Secondary | ICD-10-CM | POA: Diagnosis not present

## 2022-01-13 MED ORDER — PANTOPRAZOLE SODIUM 40 MG PO TBEC: 40.0000 mg | DELAYED_RELEASE_TABLET | Freq: Every day | ORAL | 3 refills | Status: DC

## 2022-01-13 MED ORDER — TRIAMTERENE-HCTZ 37.5-25 MG PO CAPS: 1.0000 | ORAL_CAPSULE | Freq: Every day | ORAL | 3 refills | Status: DC

## 2022-01-13 MED ORDER — AMLODIPINE BESYLATE 5 MG PO TABS: 5.0000 mg | ORAL_TABLET | Freq: Two times a day (BID) | ORAL | 3 refills | Status: DC

## 2022-01-13 MED ORDER — ALLOPURINOL 300 MG PO TABS: 300.0000 mg | ORAL_TABLET | Freq: Every day | ORAL | 3 refills | Status: DC

## 2022-01-13 MED ORDER — SERTRALINE HCL 50 MG PO TABS: ORAL_TABLET | ORAL | 3 refills | Status: DC

## 2022-01-13 MED ORDER — METOPROLOL SUCCINATE ER 50 MG PO TB24
50.0000 mg | ORAL_TABLET | Freq: Two times a day (BID) | ORAL | 3 refills | Status: DC
Start: 2022-01-13 — End: 2022-07-16

## 2022-01-13 NOTE — Progress Notes (Signed)
? ?Subjective:  ?Patient ID: William Roman, male    DOB: 07-05-1971  Age: 51 y.o. MRN: [id number] ? ?CC: ?Chief Complaint  ?Patient presents with  ? Depression  ? Anxiety  ? ? ?HPI: ? ?51 year old male presents for follow-up. ? ?Anxiety and depression is stable.  PHQ-9 score of 3.  GAD-7 score of 6.  He is doing well on Zoloft. ? ?Hypertension is stable.  He is on amlodipine, metoprolol, and triamterene HCTZ. ? ?No recent issues with gout.  Is on allopurinol.  Follows with rheumatology. ? ?Patient Active Problem List  ? Diagnosis Date Noted  ? CKD (chronic kidney disease) stage 2, GFR 60-89 ml/min 01/13/2022  ? Mixed hyperlipidemia 01/13/2022  ? Gout 09/04/2021  ? GERD (gastroesophageal reflux disease) 09/04/2021  ? Anxiety and depression 09/04/2021  ? Essential hypertension, benign 08/15/2013  ? ? ?Social Hx   ?Social History  ? ?Socioeconomic History  ? Marital status: Single  ?  Spouse name: Not on file  ? Number of children: Not on file  ? Years of education: Not on file  ? Highest education level: Not on file  ?Occupational History  ? Not on file  ?Tobacco Use  ? Smoking status: Never  ? Smokeless tobacco: Never  ?Substance and Sexual Activity  ? Alcohol use: No  ? Drug use: No  ? Sexual activity: Not on file  ?Other Topics Concern  ? Not on file  ?Social History Narrative  ? Not on file  ? ?Social Determinants of Health  ? ?Financial Resource Strain: Not on file  ?Food Insecurity: Not on file  ?Transportation Needs: Not on file  ?Physical Activity: Not on file  ?Stress: Not on file  ?Social Connections: Not on file  ? ? ?Review of Systems  ?Constitutional: Negative.   ?Psychiatric/Behavioral:  The patient is nervous/anxious.   ? ? ?Objective:  ?BP 132/86   Pulse 67   Temp 97.9 ?F (36.6 ?C) (Oral)   Ht '5\' 7"'$  (1.702 m)   Wt 222 lb 3.2 oz (100.8 kg)   SpO2 97%   BMI 34.80 kg/m?  ? ? ?  01/13/2022  ?  8:44 AM 10/16/2021  ?  8:30 AM 09/04/2021  ?  8:32 AM  ?BP/Weight  ?Systolic BP 128 786 767  ?Diastolic BP  86 82 84  ?Wt. (Lbs) 222.2 215 216  ?BMI 34.8 kg/m2 33.67 kg/m2 33.83 kg/m2  ? ? ?Physical Exam ?Vitals and nursing note reviewed.  ?Constitutional:   ?   General: He is not in acute distress. ?   Appearance: Normal appearance.  ?HENT:  ?   Head: Normocephalic and atraumatic.  ?Eyes:  ?   General:     ?   Right eye: No discharge.     ?   Left eye: No discharge.  ?   Conjunctiva/sclera: Conjunctivae normal.  ?Cardiovascular:  ?   Rate and Rhythm: Normal rate and regular rhythm.  ?Pulmonary:  ?   Effort: Pulmonary effort is normal.  ?   Breath sounds: Normal breath sounds. No wheezing, rhonchi or rales.  ?Neurological:  ?   Mental Status: He is alert.  ?Psychiatric:     ?   Mood and Affect: Mood normal.     ?   Behavior: Behavior normal.  ? ? ?Lab Results  ?Component Value Date  ? WBC 7.0 10/16/2021  ? HGB 16.4 10/16/2021  ? HCT 45.5 10/16/2021  ? PLT 214 10/16/2021  ? GLUCOSE 93 10/16/2021  ? CHOL 196  10/16/2021  ? TRIG 260 (H) 10/16/2021  ? HDL 31 (L) 10/16/2021  ? LDLCALC 119 (H) 10/16/2021  ? ALT 44 10/16/2021  ? AST 32 10/16/2021  ? NA 144 10/16/2021  ? K 5.1 10/16/2021  ? CL 102 10/16/2021  ? CREATININE 1.34 (H) 10/16/2021  ? BUN 21 10/16/2021  ? CO2 27 10/16/2021  ? HGBA1C 5.2 10/16/2021  ? ? ? ?Assessment & Plan:  ? ?Problem List Items Addressed This Visit   ? ?  ? Cardiovascular and Mediastinum  ? Essential hypertension, benign  ?  Stable.  Continue metoprolol, triamterene HCTZ, and amlodipine.  Medications were refilled today. ? ?  ?  ? Relevant Medications  ? amLODipine (NORVASC) 5 MG tablet  ? metoprolol succinate (TOPROL-XL) 50 MG 24 hr tablet  ? triamterene-hydrochlorothiazide (DYAZIDE) 37.5-25 MG capsule  ?  ? Other  ? Anxiety and depression - Primary  ?  Stable.  Doing well.  Continue Zoloft.  Refilled today. ? ?  ?  ? Relevant Medications  ? sertraline (ZOLOFT) 50 MG tablet  ? ? ?Meds ordered this encounter  ?Medications  ? allopurinol (ZYLOPRIM) 300 MG tablet  ?  Sig: Take 1 tablet (300 mg total) by  mouth daily.  ?  Dispense:  90 tablet  ?  Refill:  3  ? amLODipine (NORVASC) 5 MG tablet  ?  Sig: Take 1 tablet (5 mg total) by mouth 2 (two) times daily.  ?  Dispense:  180 tablet  ?  Refill:  3  ? metoprolol succinate (TOPROL-XL) 50 MG 24 hr tablet  ?  Sig: Take 1 tablet (50 mg total) by mouth 2 (two) times daily. Take with or immediately following a meal.  ?  Dispense:  180 tablet  ?  Refill:  3  ? pantoprazole (PROTONIX) 40 MG tablet  ?  Sig: Take 1 tablet (40 mg total) by mouth daily.  ?  Dispense:  90 tablet  ?  Refill:  3  ? triamterene-hydrochlorothiazide (DYAZIDE) 37.5-25 MG capsule  ?  Sig: Take 1 each (1 capsule total) by mouth daily.  ?  Dispense:  90 capsule  ?  Refill:  3  ? sertraline (ZOLOFT) 50 MG tablet  ?  Sig: TAKE 1 TABLET(50 MG) BY MOUTH DAILY  ?  Dispense:  90 tablet  ?  Refill:  3  ? ? ?Follow-up:  Return in about 6 months (around 07/16/2022). ? ?Thersa Salt DO ?[place] ? ?

## 2022-01-13 NOTE — Patient Instructions (Signed)
Continue your medications.    Follow up in 6 months

## 2022-01-13 NOTE — Assessment & Plan Note (Signed)
Stable.  Continue metoprolol, triamterene HCTZ, and amlodipine.  Medications were refilled today. ?

## 2022-01-13 NOTE — Assessment & Plan Note (Signed)
Stable.  Doing well.  Continue Zoloft.  Refilled today. ?

## 2022-02-18 DIAGNOSIS — R5382 Chronic fatigue, unspecified: Secondary | ICD-10-CM | POA: Diagnosis not present

## 2022-02-18 DIAGNOSIS — Z111 Encounter for screening for respiratory tuberculosis: Secondary | ICD-10-CM | POA: Diagnosis not present

## 2022-02-18 DIAGNOSIS — M109 Gout, unspecified: Secondary | ICD-10-CM | POA: Diagnosis not present

## 2022-02-18 DIAGNOSIS — M79642 Pain in left hand: Secondary | ICD-10-CM | POA: Diagnosis not present

## 2022-02-18 DIAGNOSIS — M6530 Trigger finger, unspecified finger: Secondary | ICD-10-CM | POA: Diagnosis not present

## 2022-02-18 DIAGNOSIS — M79641 Pain in right hand: Secondary | ICD-10-CM | POA: Diagnosis not present

## 2022-02-18 DIAGNOSIS — M2550 Pain in unspecified joint: Secondary | ICD-10-CM | POA: Diagnosis not present

## 2022-02-18 DIAGNOSIS — R7989 Other specified abnormal findings of blood chemistry: Secondary | ICD-10-CM | POA: Diagnosis not present

## 2022-02-20 DIAGNOSIS — G5602 Carpal tunnel syndrome, left upper limb: Secondary | ICD-10-CM | POA: Diagnosis not present

## 2022-02-20 DIAGNOSIS — G5622 Lesion of ulnar nerve, left upper limb: Secondary | ICD-10-CM | POA: Diagnosis not present

## 2022-03-03 DIAGNOSIS — M79672 Pain in left foot: Secondary | ICD-10-CM | POA: Diagnosis not present

## 2022-03-03 DIAGNOSIS — M7672 Peroneal tendinitis, left leg: Secondary | ICD-10-CM | POA: Diagnosis not present

## 2022-03-03 DIAGNOSIS — M79671 Pain in right foot: Secondary | ICD-10-CM | POA: Diagnosis not present

## 2022-03-10 DIAGNOSIS — M65351 Trigger finger, right little finger: Secondary | ICD-10-CM | POA: Diagnosis not present

## 2022-03-10 DIAGNOSIS — M65331 Trigger finger, right middle finger: Secondary | ICD-10-CM | POA: Diagnosis not present

## 2022-03-13 DIAGNOSIS — M65331 Trigger finger, right middle finger: Secondary | ICD-10-CM | POA: Diagnosis not present

## 2022-03-13 DIAGNOSIS — M65351 Trigger finger, right little finger: Secondary | ICD-10-CM | POA: Diagnosis not present

## 2022-03-17 DIAGNOSIS — M79641 Pain in right hand: Secondary | ICD-10-CM | POA: Diagnosis not present

## 2022-03-23 ENCOUNTER — Encounter (HOSPITAL_COMMUNITY): Payer: Self-pay

## 2022-03-23 ENCOUNTER — Ambulatory Visit (HOSPITAL_COMMUNITY)
Admission: EM | Admit: 2022-03-23 | Discharge: 2022-03-23 | Disposition: A | Discharge status: Home / Self Care | Payer: BC Managed Care – PPO | Attending: Internal Medicine | Admitting: Internal Medicine

## 2022-03-23 DIAGNOSIS — T148XXA Other injury of unspecified body region, initial encounter: Secondary | ICD-10-CM

## 2022-03-23 MED ORDER — ACETAMINOPHEN 500 MG PO TABS: 1000.0000 mg | ORAL_TABLET | Freq: Four times a day (QID) | ORAL | 0 refills | Status: DC | PRN

## 2022-03-23 MED ORDER — METHOCARBAMOL 500 MG PO TABS: 500.0000 mg | ORAL_TABLET | Freq: Two times a day (BID) | ORAL | 0 refills | Status: DC

## 2022-03-23 NOTE — ED Provider Notes (Signed)
[facility]    CSN: [id number] Arrival date & time: 03/23/22  1045      History   Chief Complaint Chief Complaint  Patient presents with   Back Pain    HPI William Roman is a 51 y.o. male.   Patient presents to urgent care for evaluation of right-sided thoracic back pain that started approximately 6 days ago.  Patient relates his back pain to lifting a heavy refrigerator at work and using a twisting motion to his back while performing heavy lifting.  He woke up the next day after lifting the heavy refrigerator and states that he "felt like he pulled his back out". He has been taking tylenol for his back pain without much relief. He has also been applying heat to his back and states that the heat helps temporarily, but the pain returns as soon as he takes the heating pad off of his back. Pain is worse with movement, especially twisting motions at the hips. No fever/chills, neck pain, nausea, urinary or bowel incontinence, or numbness/tingling to bilateral lower extremities. Denies recent falls, injuries, or trauma to the back.  No urinary frequency, urgency, hesitancy, or dysuria reported. No other aggravating or relieving factors identified at this time for patient's symptoms.    Back Pain   Past Medical History:  Diagnosis Date   Allergic rhinitis    Anxiety    Atrial fibrillation (HCC)    x3   Depression    Elevated glucose    Hypertension    Reflux    Stress     Patient Active Problem List   Diagnosis Date Noted   CKD (chronic kidney disease) stage 2, GFR 60-89 ml/min 01/13/2022   Mixed hyperlipidemia 01/13/2022   Gout 09/04/2021   GERD (gastroesophageal reflux disease) 09/04/2021   Anxiety and depression 09/04/2021   Essential hypertension, benign 08/15/2013    Past Surgical History:  Procedure Laterality Date   HERNIA REPAIR     TRIGGER FINGER RELEASE Right        Home Medications    Prior to Admission medications   Medication Sig Start  Date End Date Taking? Authorizing Provider  acetaminophen (TYLENOL) 500 MG tablet Take 2 tablets (1,000 mg total) by mouth every 6 (six) hours as needed. 03/23/22  Yes Carlisle Beers, FNP  allopurinol (ZYLOPRIM) 300 MG tablet Take 1 tablet (300 mg total) by mouth daily. 01/13/22  Yes Cook, Jayce G, DO  amLODipine (NORVASC) 5 MG tablet Take 1 tablet (5 mg total) by mouth 2 (two) times daily. 01/13/22  Yes Cook, Jayce G, DO  loratadine (CLARITIN) 10 MG tablet Take 10 mg by mouth daily.   Yes [provider]  methocarbamol (ROBAXIN) 500 MG tablet Take 1 tablet (500 mg total) by mouth 2 (two) times daily. 03/23/22  Yes Carlisle Beers, FNP  metoprolol succinate (TOPROL-XL) 50 MG 24 hr tablet Take 1 tablet (50 mg total) by mouth 2 (two) times daily. Take with or immediately following a meal. 01/13/22  Yes Cook, Jayce G, DO  Multiple Vitamins-Minerals (MULTIVITAMIN WITH MINERALS) tablet Take 1 tablet by mouth daily.   Yes [provider]  pantoprazole (PROTONIX) 40 MG tablet Take 1 tablet (40 mg total) by mouth daily. 01/13/22  Yes Cook, Jayce G, DO  sertraline (ZOLOFT) 50 MG tablet TAKE 1 TABLET(50 MG) BY MOUTH DAILY 01/13/22  Yes Cook, Jayce G, DO  triamterene-hydrochlorothiazide (DYAZIDE) 37.5-25 MG capsule Take 1 each (1 capsule total) by mouth daily. 01/13/22  Yes Everlene Other G, DO  Colchicine 0.6 MG CAPS Take 1 capsule by mouth 2 (two) times daily. 06/06/21   [provider]    Family History Family History  Problem Relation Age of Onset   Diabetes Father     Social History Social History   Tobacco Use   Smoking status: Never   Smokeless tobacco: Never  Substance Use Topics   Alcohol use: No   Drug use: No     Allergies   Lisinopril, Losartan, and Wellbutrin [bupropion]   Review of Systems Review of Systems  Musculoskeletal:  Positive for back pain.  Per HPI   Physical Exam Triage Vital Signs ED Triage Vitals  Enc Vitals Group     BP 03/23/22 1056  (!) 158/85     Pulse Rate 03/23/22 1056 94     Resp 03/23/22 1056 16     Temp 03/23/22 1056 98.5 F (36.9 C)     Temp Source 03/23/22 1056 Oral     SpO2 03/23/22 1056 95 %     Weight 03/23/22 1059 220 lb (99.8 kg)     Height 03/23/22 1059 5\' 8"  (1.727 m)     Head Circumference --      Peak Flow --      Pain Score 03/23/22 1059 9     Pain Loc --      Pain Edu? --      Excl. in GC? --    No data found.  Updated Vital Signs BP (!) 158/85 (BP Location: Left Arm)   Pulse 94   Temp 98.5 F (36.9 C) (Oral)   Resp 16   Ht 5\' 8"  (1.727 m)   Wt 220 lb (99.8 kg)   SpO2 95%   BMI 33.45 kg/m   Visual Acuity Right Eye Distance:   Left Eye Distance:   Bilateral Distance:    Right Eye Near:   Left Eye Near:    Bilateral Near:     Physical Exam Vitals and nursing note reviewed.  Constitutional:      Appearance: Normal appearance. He is not ill-appearing or toxic-appearing.     Comments: Very pleasant patient sitting on exam in position of comfort table in no acute distress.   HENT:     Head: Normocephalic and atraumatic.     Right Ear: Hearing and external ear normal.     Left Ear: Hearing and external ear normal.     Nose: Nose normal.     Mouth/Throat:     Lips: Pink.     Mouth: Mucous membranes are moist.  Eyes:     General: Lids are normal. Vision grossly intact. Gaze aligned appropriately.     Extraocular Movements: Extraocular movements intact.     Conjunctiva/sclera: Conjunctivae normal.  Cardiovascular:     Rate and Rhythm: Normal rate and regular rhythm.     Heart sounds: Normal heart sounds, S1 normal and S2 normal.  Pulmonary:     Effort: Pulmonary effort is normal. No respiratory distress.     Breath sounds: Normal breath sounds and air entry.  Abdominal:     General: Bowel sounds are normal.     Palpations: Abdomen is soft.     Tenderness: There is no abdominal tenderness. There is no right CVA tenderness, left CVA tenderness or guarding.   Musculoskeletal:     Cervical back: Normal and neck supple.     Thoracic back: Spasms and tenderness present.     Lumbar back: Normal.  Comments: Paraspinal thoracic tenderness mainly to the right side with palpation.  No radiculopathy elicited with palpation of bilateral sciatic joints.  No bony tenderness to the cervical, thoracic, or lumbar spine.  Pain is worse with movement.  Range of motion is not limited by tenderness with forward and bilateral flexion/extension at the hips.  Sensation intact to bilateral lower extremities.  5/5 power throughout.  Skin:    General: Skin is warm and dry.     Capillary Refill: Capillary refill takes less than 2 seconds.     Findings: No rash.  Neurological:     General: No focal deficit present.     Mental Status: He is alert and oriented to person, place, and time. Mental status is at baseline.     Cranial Nerves: No dysarthria or facial asymmetry.     Gait: Gait is intact.  Psychiatric:        Mood and Affect: Mood normal.        Speech: Speech normal.        Behavior: Behavior normal.        Thought Content: Thought content normal.        Judgment: Judgment normal.      UC Treatments / Results  Labs (all labs ordered are listed, but only abnormal results are displayed) Labs Reviewed - No data to display  EKG   Radiology No results found.  Procedures Procedures (including critical care time)  Medications Ordered in UC Medications - No data to display  Initial Impression / Assessment and Plan / UC Course  I have reviewed the triage vital signs and the nursing notes.  Pertinent labs & imaging results that were available during my care of the patient were reviewed by me and considered in my medical decision making (see chart for details).  1.  Muscle strain of thoracic spine Symptomology and physical exam consistent with acute thoracic muscle strain.  Avoiding use of NSAIDs due to stable, but elevated creatinine.  Suspect  patient's symptoms will improve with Robaxin every 12 hours as needed for muscle spasm combined with Tylenol use 1000 mg every 6 hours.  He is to continue using heat to the back and perform gentle range of motion exercises to avoid muscle stiffness.  Work note given.  No lifting anything heavier than 10 pounds for the next 3 days as muscle strain heals.   Discussed physical exam and available lab work findings in clinic with patient.  Counseled patient regarding appropriate use of medications and potential side effects for all medications recommended or prescribed today. Discussed red flag signs and symptoms of worsening condition,when to call the PCP office, return to urgent care, and when to seek higher level of care in the emergency department. Patient verbalizes understanding and agreement with plan. All questions answered. Patient discharged in stable condition.  Final Clinical Impressions(s) / UC Diagnoses   Final diagnoses:  Muscle strain     Discharge Instructions      Take Robaxin every 12 hours as needed for muscle spasm to your back.  Do not take this medication and drive, go to work, or drink alcohol as it can make you sleepy.  Mostly take this medication at night.  You may continue to take 1000 mg of Tylenol every 6 hours as needed for your pain.  The Tylenol and Robaxin together will probably provide you the most pain relief at nighttime.  Continue to use heat and gentle stretches to your back.  I suspect that  your muscle strain will resolve in the next 3 to 5 days.  Avoid lifting heavy objects for at least 3 days while this heals.  Work note is at the end of packet.  If you develop any new or worsening symptoms or do not improve in the next 2 to 3 days, please return.  If your symptoms are severe, please go to the emergency room.  Follow-up with your primary care provider for further evaluation and management of your symptoms as well as ongoing wellness visits.  I hope you feel  better!   ED Prescriptions     Medication Sig Dispense Auth. Provider   methocarbamol (ROBAXIN) 500 MG tablet Take 1 tablet (500 mg total) by mouth 2 (two) times daily. 20 tablet Reita May M, FNP   acetaminophen (TYLENOL) 500 MG tablet Take 2 tablets (1,000 mg total) by mouth every 6 (six) hours as needed. 30 tablet Carlisle Beers, FNP      PDMP not reviewed this encounter.   Carlisle Beers, [place] 03/26/22 [phone]

## 2022-03-23 NOTE — ED Triage Notes (Signed)
Patient having waves of back spasms and pain since last week. Does a lot of lifting and twisting at work. Later in the day after work started having the off and on back pain. No known injuries or falls.   No previous back problems.

## 2022-03-23 NOTE — Discharge Instructions (Addendum)
Take Robaxin every 12 hours as needed for muscle spasm to your back.  Do not take this medication and drive, go to work, or drink alcohol as it can make you sleepy.  Mostly take this medication at night.  You may continue to take 1000 mg of Tylenol every 6 hours as needed for your pain.  The Tylenol and Robaxin together will probably provide you the most pain relief at nighttime.  Continue to use heat and gentle stretches to your back.  I suspect that your muscle strain will resolve in the next 3 to 5 days.  Avoid lifting heavy objects for at least 3 days while this heals.  Work note is at the end of packet.  If you develop any new or worsening symptoms or do not improve in the next 2 to 3 days, please return.  If your symptoms are severe, please go to the emergency room.  Follow-up with your primary care provider for further evaluation and management of your symptoms as well as ongoing wellness visits.  I hope you feel better!

## 2022-03-27 DIAGNOSIS — M79641 Pain in right hand: Secondary | ICD-10-CM | POA: Diagnosis not present

## 2022-04-08 DIAGNOSIS — M7672 Peroneal tendinitis, left leg: Secondary | ICD-10-CM | POA: Diagnosis not present

## 2022-04-08 DIAGNOSIS — M79641 Pain in right hand: Secondary | ICD-10-CM | POA: Diagnosis not present

## 2022-04-15 DIAGNOSIS — M6530 Trigger finger, unspecified finger: Secondary | ICD-10-CM | POA: Diagnosis not present

## 2022-04-15 DIAGNOSIS — M2550 Pain in unspecified joint: Secondary | ICD-10-CM | POA: Diagnosis not present

## 2022-04-15 DIAGNOSIS — M109 Gout, unspecified: Secondary | ICD-10-CM | POA: Diagnosis not present

## 2022-04-15 DIAGNOSIS — M0609 Rheumatoid arthritis without rheumatoid factor, multiple sites: Secondary | ICD-10-CM | POA: Diagnosis not present

## 2022-04-24 DIAGNOSIS — M65321 Trigger finger, right index finger: Secondary | ICD-10-CM | POA: Diagnosis not present

## 2022-05-13 ENCOUNTER — Ambulatory Visit: Payer: BC Managed Care – PPO | Admitting: Family Medicine

## 2022-05-13 ENCOUNTER — Encounter: Payer: Self-pay | Admitting: Family Medicine

## 2022-05-13 DIAGNOSIS — F32A Depression, unspecified: Secondary | ICD-10-CM

## 2022-05-13 DIAGNOSIS — F419 Anxiety disorder, unspecified: Secondary | ICD-10-CM

## 2022-05-13 MED ORDER — VENLAFAXINE HCL ER 75 MG PO CP24: 75.0000 mg | ORAL_CAPSULE | Freq: Every day | ORAL | 1 refills | Status: DC

## 2022-05-13 NOTE — Patient Instructions (Signed)
Medication as prescribed.  Give it at least 6-8 weeks.  Follow up later this year.  Take care  Dr. Lacinda Axon

## 2022-05-15 NOTE — Assessment & Plan Note (Signed)
Uncontrolled.  Worsening.  Discontinuing Zoloft.  Trial of Effexor.  May need addition of mood stabilizing agent such as Abilify.  Has follow-up later this year.

## 2022-05-15 NOTE — Progress Notes (Signed)
Subjective:  Patient ID: William Roman, male    DOB: 1971/05/27  Age: 51 y.o. MRN: [id number]  CC: Chief Complaint  Patient presents with   Anxiety    Been off sertraline for 2 weeks due to insurance problem, also not helping when does take    HPI:  51 year old male with history of anxiety and depression presents for evaluation of the above.  Patient states that he has been off Zoloft for approximately 2 weeks.  He states that his mood is worsening.  He also states that the Zoloft did not seem to help significantly.  However, at his last visit he seemed to be doing well.  PHQ-9 score was 3 and GAD score was 6 at that time.  He reports that he is very quick to anger.  He states that he gets upset over every little thing.  He states that he is on edge.  He states that this is worse at work.  Also affecting his personal relationships.  He avoids seeing others due to his irritability.  Patient Active Problem List   Diagnosis Date Noted   CKD (chronic kidney disease) stage 2, GFR 60-89 ml/min 01/13/2022   Mixed hyperlipidemia 01/13/2022   Gout 09/04/2021   GERD (gastroesophageal reflux disease) 09/04/2021   Anxiety and depression 09/04/2021   Essential hypertension, benign 08/15/2013    Social Hx   Social History   Socioeconomic History   Marital status: Single    Spouse name: Not on file   Number of children: Not on file   Years of education: Not on file   Highest education level: Not on file  Occupational History   Not on file  Tobacco Use   Smoking status: Never   Smokeless tobacco: Never  Substance and Sexual Activity   Alcohol use: No   Drug use: No   Sexual activity: Not on file  Other Topics Concern   Not on file  Social History Narrative   Not on file   Social Determinants of Health   Financial Resource Strain: Not on file  Food Insecurity: Not on file  Transportation Needs: Not on file  Physical Activity: Not on file  Stress: Not on file  Social  Connections: Not on file    Review of Systems Per HPI  Objective:  BP (!) 142/88   Pulse 80   Temp 98.2 F (36.8 C)   Ht '5\' 8"'$  (1.727 m)   Wt 224 lb (101.6 kg)   SpO2 97%   BMI 34.06 kg/m      05/13/2022    2:05 PM 03/23/2022   10:59 AM 03/23/2022   10:56 AM  BP/Weight  Systolic BP 786  767  Diastolic BP 88  85  Wt. (Lbs) 224 220   BMI 34.06 kg/m2 33.45 kg/m2     Physical Exam Vitals and nursing note reviewed.  Constitutional:      General: He is not in acute distress.    Appearance: Normal appearance. He is obese.  HENT:     Head: Normocephalic and atraumatic.  Eyes:     General:        Right eye: No discharge.        Left eye: No discharge.     Conjunctiva/sclera: Conjunctivae normal.  Cardiovascular:     Rate and Rhythm: Normal rate and regular rhythm.  Pulmonary:     Effort: Pulmonary effort is normal.     Breath sounds: No wheezing, rhonchi or rales.  Neurological:  Mental Status: He is alert.  Psychiatric:        Behavior: Behavior normal.     Comments: Normal mood and affect here today.     Lab Results  Component Value Date   WBC 7.0 10/16/2021   HGB 16.4 10/16/2021   HCT 45.5 10/16/2021   PLT 214 10/16/2021   GLUCOSE 93 10/16/2021   CHOL 196 10/16/2021   TRIG 260 (H) 10/16/2021   HDL 31 (L) 10/16/2021   LDLCALC 119 (H) 10/16/2021   ALT 44 10/16/2021   AST 32 10/16/2021   NA 144 10/16/2021   K 5.1 10/16/2021   CL 102 10/16/2021   CREATININE 1.34 (H) 10/16/2021   BUN 21 10/16/2021   CO2 27 10/16/2021   HGBA1C 5.2 10/16/2021     Assessment & Plan:   Problem List Items Addressed This Visit       Other   Anxiety and depression    Uncontrolled.  Worsening.  Discontinuing Zoloft.  Trial of Effexor.  May need addition of mood stabilizing agent such as Abilify.  Has follow-up later this year.      Relevant Medications   venlafaxine XR (EFFEXOR XR) 75 MG 24 hr capsule    Meds ordered this encounter  Medications   venlafaxine XR  (EFFEXOR XR) 75 MG 24 hr capsule    Sig: Take 1 capsule (75 mg total) by mouth daily with breakfast.    Dispense:  90 capsule    Refill:  1    Follow-up: Has follow-up in November.  [place]

## 2022-06-09 ENCOUNTER — Ambulatory Visit (HOSPITAL_COMMUNITY)
Admission: EM | Admit: 2022-06-09 | Discharge: 2022-06-09 | Disposition: A | Discharge status: Home / Self Care | Payer: BC Managed Care – PPO | Attending: Emergency Medicine | Admitting: Emergency Medicine

## 2022-06-09 ENCOUNTER — Other Ambulatory Visit: Payer: Self-pay

## 2022-06-09 ENCOUNTER — Encounter (HOSPITAL_COMMUNITY): Payer: Self-pay | Admitting: *Deleted

## 2022-06-09 DIAGNOSIS — B029 Zoster without complications: Secondary | ICD-10-CM

## 2022-06-09 MED ORDER — ACYCLOVIR 400 MG PO TABS
400.0000 mg | ORAL_TABLET | Freq: Every day | ORAL | 0 refills | Status: AC
Start: 2022-06-09 — End: 2022-06-16

## 2022-06-09 NOTE — Discharge Instructions (Signed)
Take valacyclovir 5 times daily(every 4-5 hours) 7 days this medicine is to help reduce the viral load in your body which ideally will help clear the rash  Rash is considered contagious until completely scabbed over therefore keep covered  May continue Tylenol and ibuprofen as needed for pain   Blister and rash care Keep your rash covered with a loose bandage (dressing). Wear loose clothing that does not rub on your rash. Wash your hands with soap and water for at least 20 seconds before and after you change your bandage. If you cannot use soap and water, use hand sanitizer. Change your bandage as told by your doctor. Keep your rash and blisters clean. To do this, wash the area with mild soap and cool water as told by your doctor. Check your rash every day for signs of infection. Check for: More redness, swelling, or pain. Fluid or blood. Warmth. Pus or a bad smell. Do not scratch your rash. Do not pick at your blisters. To help you to not scratch: Keep your fingernails clean and cut short. Wear gloves or mittens when you sleep, if scratching is a problem.

## 2022-06-09 NOTE — ED Provider Notes (Signed)
[facility]    CSN: [id number] Arrival date & time: 06/09/22  0931      History   Chief Complaint Chief Complaint  Patient presents with   rash on RT arm    HPI William Roman is a 51 y.o. male.   Patient presents with rash to the right upper arm for 4 days, endorses pain to the right shoulder that occurred is present for 1 week prior to rash beginning.  Rash is erythematous, mildly pruritic and blistering.  No motion of the arm is intact.  Concern for shingles.  Has attempted use of ibuprofen, Tylenol and Benadryl.  Denies numbness, tingling, injury or trauma.   Past Medical History:  Diagnosis Date   Allergic rhinitis    Anxiety    Atrial fibrillation ([place])    x3   Depression    Elevated glucose    Hypertension    Reflux    Stress     Patient Active Problem List   Diagnosis Date Noted   CKD (chronic kidney disease) stage 2, GFR 60-89 ml/min 01/13/2022   Mixed hyperlipidemia 01/13/2022   Gout 09/04/2021   GERD (gastroesophageal reflux disease) 09/04/2021   Anxiety and depression 09/04/2021   Essential hypertension, benign 08/15/2013    Past Surgical History:  Procedure Laterality Date   HERNIA REPAIR     TRIGGER FINGER RELEASE Right        Home Medications    Prior to Admission medications   Medication Sig Start Date End Date Taking? Authorizing Provider  acetaminophen (TYLENOL) 500 MG tablet Take 2 tablets (1,000 mg total) by mouth every 6 (six) hours as needed. 03/23/22   Talbot Grumbling, FNP  allopurinol (ZYLOPRIM) 300 MG tablet Take 1 tablet (300 mg total) by mouth daily. 01/13/22   Coral Spikes, DO  amLODipine (NORVASC) 5 MG tablet Take 1 tablet (5 mg total) by mouth 2 (two) times daily. 01/13/22   Coral Spikes, DO  Colchicine 0.6 MG CAPS Take 1 capsule by mouth 2 (two) times daily. 06/06/21   [provider]  etanercept (ENBREL SURECLICK) 50 MG/ML injection     [provider]  loratadine (CLARITIN) 10 MG tablet  Take 10 mg by mouth daily.    [provider]  metoprolol succinate (TOPROL-XL) 50 MG 24 hr tablet Take 1 tablet (50 mg total) by mouth 2 (two) times daily. Take with or immediately following a meal. 01/13/22   Coral Spikes, DO  Multiple Vitamins-Minerals (MULTIVITAMIN WITH MINERALS) tablet Take 1 tablet by mouth daily.    [provider]  pantoprazole (PROTONIX) 40 MG tablet Take 1 tablet (40 mg total) by mouth daily. 01/13/22   Coral Spikes, DO  triamterene-hydrochlorothiazide (DYAZIDE) 37.5-25 MG capsule Take 1 each (1 capsule total) by mouth daily. 01/13/22   Coral Spikes, DO  venlafaxine XR (EFFEXOR XR) 75 MG 24 hr capsule Take 1 capsule (75 mg total) by mouth daily with breakfast. 05/13/22   Coral Spikes, DO    Family History Family History  Problem Relation Age of Onset   Diabetes Father     Social History Social History   Tobacco Use   Smoking status: Never   Smokeless tobacco: Never  Substance Use Topics   Alcohol use: No   Drug use: No     Allergies   Lisinopril, Losartan, and Wellbutrin [bupropion]   Review of Systems Review of Systems  Constitutional: Negative.   Respiratory: Negative.  Cardiovascular: Negative.   Skin:  Positive for rash. Negative for color change, pallor and wound.  Neurological: Negative.      Physical Exam Triage Vital Signs ED Triage Vitals  Enc Vitals Group     BP --      Pulse Rate 06/09/22 1105 99     Resp 06/09/22 1105 18     Temp 06/09/22 1105 97.7 F (36.5 C)     Temp src --      SpO2 06/09/22 1105 97 %     Weight --      Height --      Head Circumference --      Peak Flow --      Pain Score 06/09/22 1104 1     Pain Loc --      Pain Edu? --      Excl. in [place]? --    No data found.  Updated Vital Signs Pulse 99   Temp 97.7 F (36.5 C)   Resp 18   SpO2 97%   Visual Acuity Right Eye Distance:   Left Eye Distance:   Bilateral Distance:    Right Eye Near:   Left Eye Near:    Bilateral Near:      Physical Exam Constitutional:      Appearance: Normal appearance.  HENT:     Head: Normocephalic.  Eyes:     Extraocular Movements: Extraocular movements intact.  Pulmonary:     Effort: Pulmonary effort is normal.  Skin:    Comments: Erythematous blistering rash present to the posterior right upper arm  Neurological:     Mental Status: He is alert and oriented to person, place, and time. Mental status is at baseline.  Psychiatric:        Mood and Affect: Mood normal.        Behavior: Behavior normal.      UC Treatments / Results  Labs (all labs ordered are listed, but only abnormal results are displayed) Labs Reviewed - No data to display  EKG   Radiology No results found.  Procedures Procedures (including critical care time)  Medications Ordered in UC Medications - No data to display  Initial Impression / Assessment and Plan / UC Course  I have reviewed the triage vital signs and the nursing notes.  Pertinent labs & imaging results that were available during my care of the patient were reviewed by me and considered in my medical decision making (see chart for details).  Herpes zoster without complication  Presentation is consistent with shingles, discussed with patient, even though past 72-hour window will provide antiviral coverage as symptoms show no signs of improvement, acyclovir prescribed and discussed administration, discussed and given written handout for blister care and may continue use of over-the-counter analgesics for management of discomfort, may follow-up with urgent care or PCP regarding concerns of healing Final Clinical Impressions(s) / UC Diagnoses   Final diagnoses:  None   Discharge Instructions   None    ED Prescriptions   None    PDMP not reviewed this encounter.   Hans Eden, NP 06/09/22 1210

## 2022-06-09 NOTE — ED Triage Notes (Addendum)
Pt reports rash on RT arm and RT shoulder that started last week. On Friday Pt has chills and pain that lasted till Sunday. Pt reports he now has blisters where the rash was.Pt had Rt shoulder pain before blisters were visible. PT takes a weekly injection for joint pain.

## 2022-07-10 DIAGNOSIS — M0609 Rheumatoid arthritis without rheumatoid factor, multiple sites: Secondary | ICD-10-CM | POA: Diagnosis not present

## 2022-07-10 DIAGNOSIS — M109 Gout, unspecified: Secondary | ICD-10-CM | POA: Diagnosis not present

## 2022-07-10 DIAGNOSIS — M2550 Pain in unspecified joint: Secondary | ICD-10-CM | POA: Diagnosis not present

## 2022-07-10 DIAGNOSIS — M6530 Trigger finger, unspecified finger: Secondary | ICD-10-CM | POA: Diagnosis not present

## 2022-07-16 ENCOUNTER — Encounter: Payer: Self-pay | Admitting: Family Medicine

## 2022-07-16 ENCOUNTER — Ambulatory Visit: Payer: BC Managed Care – PPO | Admitting: Family Medicine

## 2022-07-16 VITALS — BP 128/86 | HR 92 | Wt 225.8 lb

## 2022-07-16 DIAGNOSIS — Z23 Encounter for immunization: Secondary | ICD-10-CM

## 2022-07-16 DIAGNOSIS — E782 Mixed hyperlipidemia: Secondary | ICD-10-CM

## 2022-07-16 DIAGNOSIS — M069 Rheumatoid arthritis, unspecified: Secondary | ICD-10-CM | POA: Insufficient documentation

## 2022-07-16 DIAGNOSIS — Z0000 Encounter for general adult medical examination without abnormal findings: Secondary | ICD-10-CM

## 2022-07-16 DIAGNOSIS — Z79899 Other long term (current) drug therapy: Secondary | ICD-10-CM | POA: Diagnosis not present

## 2022-07-16 DIAGNOSIS — R195 Other fecal abnormalities: Secondary | ICD-10-CM

## 2022-07-16 DIAGNOSIS — I10 Essential (primary) hypertension: Secondary | ICD-10-CM

## 2022-07-16 DIAGNOSIS — Z1211 Encounter for screening for malignant neoplasm of colon: Secondary | ICD-10-CM

## 2022-07-16 DIAGNOSIS — F32A Depression, unspecified: Secondary | ICD-10-CM

## 2022-07-16 DIAGNOSIS — F419 Anxiety disorder, unspecified: Secondary | ICD-10-CM

## 2022-07-16 MED ORDER — ALLOPURINOL 300 MG PO TABS
300.0000 mg | ORAL_TABLET | Freq: Every day | ORAL | 3 refills | Status: DC
Start: 2022-07-16 — End: 2023-01-14

## 2022-07-16 MED ORDER — TRIAMTERENE-HCTZ 37.5-25 MG PO CAPS: 1.0000 | ORAL_CAPSULE | Freq: Every day | ORAL | 3 refills | Status: DC

## 2022-07-16 MED ORDER — VENLAFAXINE HCL ER 75 MG PO CP24: 75.0000 mg | ORAL_CAPSULE | Freq: Every day | ORAL | 3 refills | Status: DC

## 2022-07-16 MED ORDER — PANTOPRAZOLE SODIUM 40 MG PO TBEC: 40.0000 mg | DELAYED_RELEASE_TABLET | Freq: Every day | ORAL | 3 refills | Status: DC

## 2022-07-16 MED ORDER — METOPROLOL SUCCINATE ER 50 MG PO TB24: 50.0000 mg | ORAL_TABLET | Freq: Two times a day (BID) | ORAL | 3 refills | Status: DC

## 2022-07-16 MED ORDER — AMLODIPINE BESYLATE 5 MG PO TABS: 5.0000 mg | ORAL_TABLET | Freq: Two times a day (BID) | ORAL | 3 refills | Status: DC

## 2022-07-16 NOTE — Assessment & Plan Note (Signed)
Improved on Effexor. Continue.

## 2022-07-16 NOTE — Progress Notes (Signed)
Subjective:  Patient ID: William Roman, male    DOB: [birth year]  Age: 51 y.o. MRN: [id number]  CC: Chief Complaint  Patient presents with   Depression    Pt arrives for follow up. Pt had shingles 06/09/22 and they seem to think Enbrel. No issues at this time    HPI:  51 year old male with RA, Gout, GERD, HTN, HLD, Anxiety and Depression, CKD presents for follow up.   Depression/anxiety has improved on Effexor. He states that he is doing well at this time.   HTN at goal on Metoprolol, Triamterene/HCTZ, and Norasc. Doing well.  Following with Rheumatology.  Significant improvement on Enbrel.  Needs refills on meds.  Health maintenance - Desires flu shot today. Needs colonoscopy - will discuss today.  Patient Active Problem List   Diagnosis Date Noted   RA (rheumatoid arthritis) ([place]) 07/16/2022   Health care maintenance 07/16/2022   CKD (chronic kidney disease) stage 2, GFR 60-89 ml/min 01/13/2022   Mixed hyperlipidemia 01/13/2022   Gout 09/04/2021   GERD (gastroesophageal reflux disease) 09/04/2021   Anxiety and depression 09/04/2021   Essential hypertension, benign 08/15/2013    Social Hx   Social History   Socioeconomic History   Marital status: Single    Spouse name: Not on file   Number of children: Not on file   Years of education: Not on file   Highest education level: Not on file  Occupational History   Not on file  Tobacco Use   Smoking status: Never   Smokeless tobacco: Never  Substance and Sexual Activity   Alcohol use: No   Drug use: No   Sexual activity: Not on file  Other Topics Concern   Not on file  Social History Narrative   Not on file   Social Determinants of Health   Financial Resource Strain: Not on file  Food Insecurity: Not on file  Transportation Needs: Not on file  Physical Activity: Not on file  Stress: Not on file  Social Connections: Not on file    Review of Systems  Constitutional: Negative.   Respiratory:  Negative.    Cardiovascular: Negative.    Objective:  BP 128/86   Pulse 92   Wt 225 lb 12.8 oz (102.4 kg)   SpO2 97%   BMI 34.33 kg/m      07/16/2022    8:26 AM 05/13/2022    2:05 PM 03/23/2022   10:59 AM  BP/Weight  Systolic BP 403 474   Diastolic BP 86 88   Wt. (Lbs) 225.8 224 220  BMI 34.33 kg/m2 34.06 kg/m2 33.45 kg/m2    Physical Exam Vitals and nursing note reviewed.  Constitutional:      General: He is not in acute distress.    Appearance: He is well-developed.  HENT:     Head: Normocephalic and atraumatic.     Nose: Nose normal.  Eyes:     General: No scleral icterus.    Conjunctiva/sclera: Conjunctivae normal.  Neck:     Thyroid: No thyromegaly.  Cardiovascular:     Rate and Rhythm: Normal rate and regular rhythm.     Heart sounds: No murmur heard. Pulmonary:     Effort: Pulmonary effort is normal.     Breath sounds: Normal breath sounds. No wheezing or rales.  Abdominal:     General: There is no distension.     Palpations: Abdomen is soft.     Tenderness: There is no abdominal tenderness.  Musculoskeletal:  Cervical back: Neck supple.  Lymphadenopathy:     Cervical: No cervical adenopathy.  Skin:    General: Skin is warm and dry.     Findings: No rash.  Neurological:     Mental Status: He is alert.     Lab Results  Component Value Date   WBC 7.0 10/16/2021   HGB 16.4 10/16/2021   HCT 45.5 10/16/2021   PLT 214 10/16/2021   GLUCOSE 93 10/16/2021   CHOL 196 10/16/2021   TRIG 260 (H) 10/16/2021   HDL 31 (L) 10/16/2021   LDLCALC 119 (H) 10/16/2021   ALT 44 10/16/2021   AST 32 10/16/2021   NA 144 10/16/2021   K 5.1 10/16/2021   CL 102 10/16/2021   CREATININE 1.34 (H) 10/16/2021   BUN 21 10/16/2021   CO2 27 10/16/2021   HGBA1C 5.2 10/16/2021     Assessment & Plan:   Problem List Items Addressed This Visit       Cardiovascular and Mediastinum   Essential hypertension, benign    Stable. Continue current medications. Meds refilled  today.      Relevant Medications   amLODipine (NORVASC) 5 MG tablet   metoprolol succinate (TOPROL-XL) 50 MG 24 hr tablet   triamterene-hydrochlorothiazide (DYAZIDE) 37.5-25 MG capsule     Other   Mixed hyperlipidemia    Lipid panel today to reassess.       Relevant Medications   amLODipine (NORVASC) 5 MG tablet   metoprolol succinate (TOPROL-XL) 50 MG 24 hr tablet   triamterene-hydrochlorothiazide (DYAZIDE) 37.5-25 MG capsule   Other Relevant Orders   Lipid panel   Health care maintenance    Flu shot today. Declines colonoscopy but okay with cologuard. Order placed.       Anxiety and depression    Improved on Effexor. Continue.       Relevant Medications   venlafaxine XR (EFFEXOR XR) 75 MG 24 hr capsule   Other Visit Diagnoses     High risk medication use       Relevant Orders   CBC   CMP14+EGFR   Colon cancer screening       Relevant Orders   Cologuard   Need for vaccination       Relevant Orders   Flu Vaccine QUAD 6+ mos PF IM (Fluarix Quad PF) (Completed)       Meds ordered this encounter  Medications   allopurinol (ZYLOPRIM) 300 MG tablet    Sig: Take 1 tablet (300 mg total) by mouth daily.    Dispense:  90 tablet    Refill:  3   amLODipine (NORVASC) 5 MG tablet    Sig: Take 1 tablet (5 mg total) by mouth 2 (two) times daily.    Dispense:  180 tablet    Refill:  3   metoprolol succinate (TOPROL-XL) 50 MG 24 hr tablet    Sig: Take 1 tablet (50 mg total) by mouth 2 (two) times daily. Take with or immediately following a meal.    Dispense:  180 tablet    Refill:  3   pantoprazole (PROTONIX) 40 MG tablet    Sig: Take 1 tablet (40 mg total) by mouth daily.    Dispense:  90 tablet    Refill:  3   triamterene-hydrochlorothiazide (DYAZIDE) 37.5-25 MG capsule    Sig: Take 1 each (1 capsule total) by mouth daily.    Dispense:  90 capsule    Refill:  3   venlafaxine XR (EFFEXOR XR) 75 MG  24 hr capsule    Sig: Take 1 capsule (75 mg total) by mouth daily  with breakfast.    Dispense:  90 capsule    Refill:  3    Follow-up:  Return in about 6 months (around 01/14/2023).  [place]

## 2022-07-16 NOTE — Patient Instructions (Signed)
Meds refilled.  Labs today.  Follow up in 6 months.  Take care  Dr. Lacinda Axon

## 2022-07-16 NOTE — Assessment & Plan Note (Signed)
Stable. Continue current medications. Meds refilled today.

## 2022-07-16 NOTE — Assessment & Plan Note (Signed)
Flu shot today. Declines colonoscopy but okay with cologuard. Order placed.

## 2022-07-16 NOTE — Assessment & Plan Note (Signed)
Lipid panel today to reassess. 

## 2022-07-17 LAB — LIPID PANEL
Chol/HDL Ratio: 8.1 ratio — ABNORMAL HIGH (ref 0.0–5.0)
Cholesterol, Total: 218 mg/dL — ABNORMAL HIGH (ref 100–199)
HDL: 27 mg/dL — ABNORMAL LOW (ref >39)
LDL Chol Calc (NIH): 114 mg/dL — ABNORMAL HIGH (ref 0–99)
Triglycerides: 441 mg/dL — ABNORMAL HIGH (ref 0–149)
VLDL Cholesterol Cal: 77 mg/dL — ABNORMAL HIGH (ref 5–40)

## 2022-07-17 LAB — CMP14+EGFR
ALT: 65 IU/L — ABNORMAL HIGH (ref 0–44)
AST: 36 IU/L (ref 0–40)
Albumin/Globulin Ratio: 2.3 — ABNORMAL HIGH (ref 1.2–2.2)
Albumin: 4.8 g/dL (ref 4.1–5.1)
Alkaline Phosphatase: 70 IU/L (ref 44–121)
BUN/Creatinine Ratio: 15 (ref 9–20)
BUN: 19 mg/dL (ref 6–24)
Bilirubin Total: 0.5 mg/dL (ref 0.0–1.2)
CO2: 25 mmol/L (ref 20–29)
Calcium: 10 mg/dL (ref 8.7–10.2)
Chloride: 102 mmol/L (ref 96–106)
Creatinine, Ser: 1.27 mg/dL (ref 0.76–1.27)
Globulin, Total: 2.1 g/dL (ref 1.5–4.5)
Glucose: 101 mg/dL — ABNORMAL HIGH (ref 70–99)
Potassium: 4.9 mmol/L (ref 3.5–5.2)
Sodium: 141 mmol/L (ref 134–144)
Total Protein: 6.9 g/dL (ref 6.0–8.5)
eGFR: 70 mL/min/{1.73_m2} (ref >59)

## 2022-07-17 LAB — CBC
Hematocrit: 47.3 % (ref 37.5–51.0)
Hemoglobin: 17 g/dL (ref 13.0–17.7)
MCH: 32 pg (ref 26.6–33.0)
MCHC: 35.9 g/dL — ABNORMAL HIGH (ref 31.5–35.7)
MCV: 89 fL (ref 79–97)
Platelets: 195 10*3/uL (ref 150–450)
RBC: 5.31 x10E6/uL (ref 4.14–5.80)
RDW: 13.1 % (ref 11.6–15.4)
WBC: 6.2 10*3/uL (ref 3.4–10.8)

## 2022-07-21 ENCOUNTER — Other Ambulatory Visit: Payer: Self-pay | Admitting: Family Medicine

## 2022-08-03 DIAGNOSIS — Z1211 Encounter for screening for malignant neoplasm of colon: Secondary | ICD-10-CM | POA: Diagnosis not present

## 2022-08-13 LAB — COLOGUARD: COLOGUARD: POSITIVE — AB

## 2022-08-14 NOTE — Addendum Note (Signed)
Addended by: Dairl Ponder on: 08/14/2022 09:20 AM   Modules accepted: Orders

## 2022-10-13 ENCOUNTER — Ambulatory Visit (AMBULATORY_SURGERY_CENTER): Payer: BC Managed Care – PPO | Admitting: *Deleted

## 2022-10-13 VITALS — Ht 68.0 in | Wt 217.0 lb

## 2022-10-13 DIAGNOSIS — R195 Other fecal abnormalities: Secondary | ICD-10-CM

## 2022-10-13 MED ORDER — NA SULFATE-K SULFATE-MG SULF 17.5-3.13-1.6 GM/177ML PO SOLN: 1.0000 | Freq: Once | ORAL | 0 refills | Status: AC

## 2022-10-13 NOTE — Progress Notes (Signed)
No egg or soy allergy known to patient  No issues known to pt with past sedation with any surgeries or procedures Patient denies ever being told they had issues or difficulty with intubation  No FH of Malignant Hyperthermia Pt is not on diet pills Pt is not on  home 02  Pt is not on blood thinners  Pt denies issues with constipation  H/o A fib 20 years ago or A flutter Have any cardiac testing pending--no Pt instructed to use [email or web address] or GoodRx for a price reduction on prep

## 2022-10-21 ENCOUNTER — Encounter (HOSPITAL_COMMUNITY): Payer: Self-pay

## 2022-10-21 ENCOUNTER — Ambulatory Visit (HOSPITAL_COMMUNITY)
Admission: EM | Admit: 2022-10-21 | Discharge: 2022-10-21 | Disposition: A | Discharge status: Home / Self Care | Payer: BC Managed Care – PPO | Attending: Family Medicine | Admitting: Family Medicine

## 2022-10-21 DIAGNOSIS — M545 Low back pain, unspecified: Secondary | ICD-10-CM

## 2022-10-21 MED ORDER — HYDROCODONE-ACETAMINOPHEN 5-325 MG PO TABS: 1.0000 | ORAL_TABLET | Freq: Four times a day (QID) | ORAL | 0 refills | Status: DC | PRN

## 2022-10-21 MED ORDER — PREDNISONE 10 MG (21) PO TBPK: ORAL_TABLET | Freq: Every day | ORAL | 0 refills | Status: DC

## 2022-10-21 MED ORDER — METHOCARBAMOL 500 MG PO TABS: 500.0000 mg | ORAL_TABLET | Freq: Two times a day (BID) | ORAL | 0 refills | Status: DC

## 2022-10-21 NOTE — ED Triage Notes (Signed)
Pt is here for right side and back pain x 3wks

## 2022-10-21 NOTE — Discharge Instructions (Signed)
HOME CARE INSTRUCTIONS: For many people, back pain returns. Since low back pain is rarely dangerous, it is often a condition that people can learn to manage on their own. Please remain active. It is stressful on the back to sit or stand in one place. Do not sit, drive, or stand in one place for more than 30 minutes at a time. Take short walks on level surfaces as soon as pain allows. Try to increase the length of time you walk each day. Do not stay in bed. Resting more than 1 or 2 days can delay your recovery. Do not avoid exercise or work. Your body is made to move. It is not dangerous to be active, even though your back may hurt. Your back will likely heal faster if you return to being active before your pain is gone. Over-the-counter medicines to reduce pain and inflammation are often the most helpful.  SEEK MEDICAL CARE IF: You have pain that is not relieved with rest or medicine. You have pain that does not improve in 1 week. You have new symptoms. You are generally not feeling well.  SEEK IMMEDIATE MEDICAL CARE IF: You have pain that radiates from your back into your legs. You develop new bowel or bladder control problems. You have unusual weakness or numbness in your arms or legs. You develop nausea or vomiting. You develop abdominal pain. You feel faint.  Be aware, you have been prescribed pain medications that may cause drowsiness. While taking this medication, do not take any other medications containing acetaminophen (Tylenol). Do not combine with alcohol or recreational drugs. Please do not drive, operate heavy machinery, or take part in activities that require making important decisions while on this medication as your judgement may be clouded.

## 2022-10-22 NOTE — ED Provider Notes (Signed)
[facility]   [id number] 10/21/22 Arrival Time: 3299  ASSESSMENT & PLAN:  1. Acute right-sided low back pain without sciatica    Able to ambulate here and hemodynamically stable. No indication for imaging of back at this time given no trauma and normal neurological exam. Discussed.  Meds ordered this encounter  Medications   HYDROcodone-acetaminophen (NORCO/VICODIN) 5-325 MG tablet    Sig: Take 1 tablet by mouth every 6 (six) hours as needed for moderate pain or severe pain.    Dispense:  8 tablet    Refill:  0   predniSONE (STERAPRED UNI-PAK 21 TAB) 10 MG (21) TBPK tablet    Sig: Take by mouth daily. Take as directed.    Dispense:  21 tablet    Refill:  0   methocarbamol (ROBAXIN) 500 MG tablet    Sig: Take 1 tablet (500 mg total) by mouth 2 (two) times daily.    Dispense:  20 tablet    Refill:  0   Work/school excuse note declined. Medication sedation precautions given. Encourage ROM/movement as tolerated.  Recommend:  Follow-up Information     Schedule an appointment as soon as possible for a visit  with Coral Spikes, DO.   Specialty: Family Medicine Contact information: [place] [address] [phone]                 Reviewed expectations re: course of current medical issues. Questions answered. Outlined signs and symptoms indicating need for more acute intervention. Patient verbalized understanding. After Visit Summary given.   SUBJECTIVE: History from: patient.  William Roman is a 52 y.o. male who presents with complaint of R sided low back pain; gradual onset; first noted approx 3 weeks ago; no trauma; does lift heavy items at work and questions relation. No extremity sensation changes or weakness. No abd pain. OTC analgesics without relief. Pain is affecting sleep. Normal bowel/bladder habits.  OBJECTIVE:  Vitals:   10/21/22 1641  BP: 137/80  Pulse: 88  Resp: 16  Temp: 98 F (36.7 C)  TempSrc: Oral   SpO2: 95%    General appearance: alert; no distress HEENT: [place]; AT Neck: supple with FROM; without midline tenderness CV: regular Lungs: unlabored respirations; speaks full sentences without difficulty Abdomen: soft, non-tender; non-distended Back: moderate and poorly localized tenderness to palpation over right paraspinal musculature ; FROM at waist; bruising: none; without midline tenderness Extremities: without edema; symmetrical without gross deformities; normal ROM of bilateral LE Skin: warm and dry Neurologic: normal gait; normal sensation and strength of bilateral LE Psychological: alert and cooperative; normal mood and affect  Allergies  Allergen Reactions   Lisinopril Cough   Losartan Other (See Comments)    ED   Wellbutrin [Bupropion]     Elevates emotion    Past Medical History:  Diagnosis Date   Allergic rhinitis    Allergy    seasonal   Anemia    as a child   Anxiety    Arthritis    hands,knees   Atrial fibrillation ([facility])    x3   Depression    Elevated glucose    GERD (gastroesophageal reflux disease)    Hyperlipidemia    Hypertension    Reflux    Stress    Social History   Socioeconomic History   Marital status: Single    Spouse name: Not on file   Number of children: Not on file   Years of education: Not on file   Highest  education level: Not on file  Occupational History   Not on file  Tobacco Use   Smoking status: Never   Smokeless tobacco: Never  Vaping Use   Vaping Use: Never used  Substance and Sexual Activity   Alcohol use: No   Drug use: No   Sexual activity: Not on file  Other Topics Concern   Not on file  Social History Narrative   Not on file   Social Determinants of Health   Financial Resource Strain: Not on file  Food Insecurity: Not on file  Transportation Needs: Not on file  Physical Activity: Not on file  Stress: Not on file  Social Connections: Not on file  Intimate Partner Violence: Not on file   Family  History  Problem Relation Age of Onset   Diabetes Father    Colon cancer Neg Hx    Colon polyps Neg Hx    Crohn's disease Neg Hx    Esophageal cancer Neg Hx    Rectal cancer Neg Hx    Stomach cancer Neg Hx    Ulcerative colitis Neg Hx    Past Surgical History:  Procedure Laterality Date   HERNIA REPAIR     TRIGGER FINGER RELEASE Right    gagn. cyst removed      Vanessa Kick, MD 10/22/22 1013

## 2022-11-03 ENCOUNTER — Encounter: Payer: Self-pay | Admitting: Gastroenterology

## 2022-11-12 DIAGNOSIS — M109 Gout, unspecified: Secondary | ICD-10-CM | POA: Diagnosis not present

## 2022-11-12 DIAGNOSIS — M6530 Trigger finger, unspecified finger: Secondary | ICD-10-CM | POA: Diagnosis not present

## 2022-11-12 DIAGNOSIS — M0609 Rheumatoid arthritis without rheumatoid factor, multiple sites: Secondary | ICD-10-CM | POA: Diagnosis not present

## 2022-11-12 DIAGNOSIS — R7989 Other specified abnormal findings of blood chemistry: Secondary | ICD-10-CM | POA: Diagnosis not present

## 2022-11-14 ENCOUNTER — Ambulatory Visit (AMBULATORY_SURGERY_CENTER): Payer: BC Managed Care – PPO | Admitting: Gastroenterology

## 2022-11-14 ENCOUNTER — Encounter: Payer: Self-pay | Admitting: Gastroenterology

## 2022-11-14 VITALS — BP 136/86 | HR 77 | Temp 98.9°F | Resp 18 | Ht 68.0 in | Wt 217.0 lb

## 2022-11-14 DIAGNOSIS — Z1211 Encounter for screening for malignant neoplasm of colon: Secondary | ICD-10-CM | POA: Diagnosis not present

## 2022-11-14 DIAGNOSIS — D128 Benign neoplasm of rectum: Secondary | ICD-10-CM | POA: Diagnosis not present

## 2022-11-14 DIAGNOSIS — D122 Benign neoplasm of ascending colon: Secondary | ICD-10-CM

## 2022-11-14 DIAGNOSIS — D125 Benign neoplasm of sigmoid colon: Secondary | ICD-10-CM | POA: Diagnosis not present

## 2022-11-14 DIAGNOSIS — R195 Other fecal abnormalities: Secondary | ICD-10-CM

## 2022-11-14 DIAGNOSIS — D120 Benign neoplasm of cecum: Secondary | ICD-10-CM

## 2022-11-14 DIAGNOSIS — K635 Polyp of colon: Secondary | ICD-10-CM | POA: Diagnosis not present

## 2022-11-14 MED ORDER — SODIUM CHLORIDE 0.9 % IV SOLN: 500.0000 mL | INTRAVENOUS | Status: DC

## 2022-11-14 NOTE — Patient Instructions (Signed)
Please read handouts provided. Continue present medications. Await pathology results. High Fiber Diet encouraged.    YOU HAD AN ENDOSCOPIC PROCEDURE TODAY AT [facility]:   Refer to the procedure report that was given to you for any specific questions about what was found during the examination.  If the procedure report does not answer your questions, please call your gastroenterologist to clarify.  If you requested that your care partner not be given the details of your procedure findings, then the procedure report has been included in a sealed envelope for you to review at your convenience later.  YOU SHOULD EXPECT: Some feelings of bloating in the abdomen. Passage of more gas than usual.  Walking can help get rid of the air that was put into your GI tract during the procedure and reduce the bloating. If you had a lower endoscopy (such as a colonoscopy or flexible sigmoidoscopy) you may notice spotting of blood in your stool or on the toilet paper. If you underwent a bowel prep for your procedure, you may not have a normal bowel movement for a few days.  Please Note:  You might notice some irritation and congestion in your nose or some drainage.  This is from the oxygen used during your procedure.  There is no need for concern and it should clear up in a day or so.  SYMPTOMS TO REPORT IMMEDIATELY:  Following lower endoscopy (colonoscopy or flexible sigmoidoscopy):  Excessive amounts of blood in the stool  Significant tenderness or worsening of abdominal pains  Swelling of the abdomen that is new, acute  Fever of 100F or higher   For urgent or emergent issues, a gastroenterologist can be reached at any hour by calling [phone]. Do not use MyChart messaging for urgent concerns.    DIET:  We do recommend a small meal at first, but then you may proceed to your regular diet.  Drink plenty of fluids but you should avoid alcoholic beverages for 24 hours.  ACTIVITY:  You  should plan to take it easy for the rest of today and you should NOT DRIVE or use heavy machinery until tomorrow (because of the sedation medicines used during the test).    FOLLOW UP: Our staff will call the number listed on your records the next business day following your procedure.  We will call around 7:15- 8:00 am to check on you and address any questions or concerns that you may have regarding the information given to you following your procedure. If we do not reach you, we will leave a message.     If any biopsies were taken you will be contacted by phone or by letter within the next 1-3 weeks.  Please call us at [phone] if you have not heard about the biopsies in 3 weeks.    SIGNATURES/CONFIDENTIALITY: You and/or your care partner have signed paperwork which will be entered into your electronic medical record.  These signatures attest to the fact that that the information above on your After Visit Summary has been reviewed and is understood.  Full responsibility of the confidentiality of this discharge information lies with you and/or your care-partner.

## 2022-11-14 NOTE — Progress Notes (Signed)
Pt's states no medical or surgical changes since previsit or office visit. 

## 2022-11-14 NOTE — Progress Notes (Signed)
Called to room to assist during endoscopic procedure.  Patient ID and intended procedure confirmed with present staff. Received instructions for my participation in the procedure from the performing physician.  

## 2022-11-14 NOTE — Op Note (Signed)
[facility] Patient Name: William Roman Procedure Date: 11/14/2022 8:36 AM MRN: [id number] Endoscopist: Thornton Park MD, MD, [id number] Age: 52 Referring MD:  Date of Birth: 1971-02-12 Gender: Male Account #: [id number] Procedure:                Colonoscopy Indications:              Positive Cologuard test Medicines:                Monitored Anesthesia Care Procedure:                Pre-Anesthesia Assessment:                           - Prior to the procedure, a History and Physical                            was performed, and patient medications and                            allergies were reviewed. The patient's tolerance of                            previous anesthesia was also reviewed. The risks                            and benefits of the procedure and the sedation                            options and risks were discussed with the patient.                            All questions were answered, and informed consent                            was obtained. Prior Anticoagulants: The patient has                            taken no anticoagulant or antiplatelet agents. ASA                            Grade Assessment: II - A patient with mild systemic                            disease. After reviewing the risks and benefits,                            the patient was deemed in satisfactory condition to                            undergo the procedure.                           After obtaining informed consent, the colonoscope  was passed under direct vision. Throughout the                            procedure, the patient's blood pressure, pulse, and                            oxygen saturations were monitored continuously. The                            CF [id number] was introduced through the anus                            and advanced to the 3 cm into the ileum. A second                            forward view of the right  colon was performed. The                            colonoscopy was performed with moderate difficulty                            due to a redundant colon. The patient tolerated the                            procedure well. The quality of the bowel                            preparation was good. The ileocecal valve,                            appendiceal orifice, and rectum were photographed. Scope In: 8:40:12 AM Scope Out: 9:10:18 AM Scope Withdrawal Time: 0 hours 26 minutes 0 seconds  Total Procedure Duration: 0 hours 30 minutes 6 seconds  Findings:                 The perianal and digital rectal examinations were                            normal.                           Non-bleeding internal hemorrhoids were found.                           A few small-mouthed diverticula were found in the                            sigmoid colon.                           A 4 mm polyp was found in the rectum. The polyp was                            flat. The polyp was removed with a cold  snare.                            Resection and retrieval were complete. Estimated                            blood loss was minimal.                           An 18 mm polyp was found in the proximal rectum.                            The polyp was semi-pedunculated. The polyp was                            removed with a hot snare. Resection and retrieval                            were complete. Area was tattooed with an injection                            of 2 mL of Spot (carbon black).                           A 4 mm polyp was found in the sigmoid colon. The                            polyp was sessile. The polyp was removed with a                            cold snare. Resection and retrieval were complete.                            Area was tattooed with an injection of 1 mL of Spot                            (carbon black).                           A 3 mm polyp was found in the ascending colon. The                             polyp was sessile. The polyp was removed with a                            cold snare. Resection and retrieval were complete.                            Estimated blood loss was minimal.                           A 10 mm polyp was found in the cecum. The polyp was  flat. The polyp was removed with a cold snare.                            Resection and retrieval were complete. Estimated                            blood loss was minimal.                           The exam was otherwise without abnormality on                            direct and retroflexion views. Complications:            No immediate complications. Estimated Blood Loss:     Estimated blood loss was minimal. Impression:               - Non-bleeding internal hemorrhoids.                           - Diverticulosis in the sigmoid colon.                           - One 4 mm polyp in the rectum, removed with a cold                            snare. Resected and retrieved.                           - One 18 mm polyp in the proximal rectum, removed                            with a hot snare. Resected and retrieved. Tattooed.                           - One 4 mm polyp in the sigmoid colon, removed with                            a cold snare. Resected and retrieved. Tattooed.                           - One 3 mm polyp in the ascending colon, removed                            with a cold snare. Resected and retrieved.                           - One 10 mm polyp in the cecum, removed with a cold                            snare. Resected and retrieved.                           - The examination was otherwise normal on direct  and retroflexion views. Recommendation:           - Patient has a contact number available for                            emergencies. The signs and symptoms of potential                            delayed complications were  discussed with the                            patient. Return to normal activities tomorrow.                            Written discharge instructions were provided to the                            patient.                           - Resume previous diet. High fiber diet encouraged.                           - Continue present medications.                           - Await pathology results.                           - Repeat colonoscopy date to be determined after                            pending pathology results are reviewed for                            surveillance.                           - Emerging evidence supports eating a diet of                            fruits, vegetables, grains, calcium, and yogurt                            while reducing red meat and alcohol may reduce the                            risk of colon cancer.                           - Given these results, all first degree relatives                            (brothers, sisters, children, parents) should start  colon cancer screening at age 63.                           - Thank you for allowing me to be involved in your                            colon cancer prevention. Thornton Park MD, MD 11/14/2022 9:18:04 AM This report has been signed electronically.

## 2022-11-14 NOTE — Progress Notes (Signed)
Referring Provider: Coral Spikes, DO Primary Care Physician:  Coral Spikes, DO   Indication for Colonoscopy: Positive Cologuard  IMPRESSION:  Positive Cologuard Appropriate candidate for monitored anesthesia care  PLAN: Colonoscopy in the [facility] today   HPI: William Roman is a 52 y.o. male presents for colonoscopy to evaluate a positive Cologuard.  No prior colonoscopy or colon cancer screening.  No known family history of colon cancer or polyps. No family history of uterine/endometrial cancer, pancreatic cancer or gastric/stomach cancer.   Past Medical History:  Diagnosis Date   Allergic rhinitis    Allergy    seasonal   Anemia    as a child   Anxiety    Arthritis    hands,knees   Atrial fibrillation (HCC)    x3   Depression    Elevated glucose    GERD (gastroesophageal reflux disease)    Hyperlipidemia    Hypertension    Reflux    Stress     Past Surgical History:  Procedure Laterality Date   HERNIA REPAIR     TRIGGER FINGER RELEASE Right    gagn. cyst removed    Current Outpatient Medications  Medication Sig Dispense Refill   allopurinol (ZYLOPRIM) 300 MG tablet Take 1 tablet (300 mg total) by mouth daily. 90 tablet 3   amLODipine (NORVASC) 5 MG tablet Take 1 tablet (5 mg total) by mouth 2 (two) times daily. 180 tablet 3   Colchicine 0.6 MG CAPS Take 1 capsule by mouth 2 (two) times daily.     etanercept (ENBREL SURECLICK) 50 MG/ML injection      loratadine (CLARITIN) 10 MG tablet Take 10 mg by mouth daily.     methocarbamol (ROBAXIN) 500 MG tablet Take 1 tablet (500 mg total) by mouth 2 (two) times daily. 20 tablet 0   metoprolol succinate (TOPROL-XL) 50 MG 24 hr tablet Take 1 tablet (50 mg total) by mouth 2 (two) times daily. Take with or immediately following a meal. 180 tablet 3   pantoprazole (PROTONIX) 40 MG tablet Take 1 tablet (40 mg total) by mouth daily. 90 tablet 3   triamterene-hydrochlorothiazide (DYAZIDE) 37.5-25 MG capsule Take 1  each (1 capsule total) by mouth daily. 90 capsule 3   acyclovir (ZOVIRAX) 400 MG tablet Take by mouth as needed. (Patient not taking: Reported on 10/13/2022)     HYDROcodone-acetaminophen (NORCO/VICODIN) 5-325 MG tablet Take 1 tablet by mouth every 6 (six) hours as needed for moderate pain or severe pain. 8 tablet 0   Multiple Vitamins-Minerals (MULTIVITAMIN WITH MINERALS) tablet Take 1 tablet by mouth daily.     predniSONE (STERAPRED UNI-PAK 21 TAB) 10 MG (21) TBPK tablet Take by mouth daily. Take as directed. 21 tablet 0   venlafaxine XR (EFFEXOR XR) 75 MG 24 hr capsule Take 1 capsule (75 mg total) by mouth daily with breakfast. 90 capsule 3   Current Facility-Administered Medications  Medication Dose Route Frequency Provider Last Rate Last Admin   0.9 %  sodium chloride infusion  500 mL Intravenous Continuous Thornton Park, MD        Allergies as of 11/14/2022 - Review Complete 11/14/2022  Allergen Reaction Noted   Lisinopril Cough 01/12/2013   Losartan Other (See Comments) 10/26/2017   Wellbutrin [bupropion]  01/12/2013    Family History  Problem Relation Age of Onset   Diabetes Father    Colon cancer Neg Hx    Colon polyps Neg Hx    Crohn's disease Neg Hx  Esophageal cancer Neg Hx    Rectal cancer Neg Hx    Stomach cancer Neg Hx    Ulcerative colitis Neg Hx      Physical Exam: General:   Alert,  well-nourished, pleasant and cooperative in NAD Head:  Normocephalic and atraumatic. Eyes:  Sclera clear, no icterus.   Conjunctiva pink. Mouth:  No deformity or lesions.   Neck:  Supple; no masses or thyromegaly. Lungs:  Clear throughout to auscultation.   No wheezes. Heart:  Regular rate and rhythm; no murmurs. Abdomen:  Soft, non-tender, nondistended, normal bowel sounds, no rebound or guarding.  Msk:  Symmetrical. No boney deformities LAD: No inguinal or umbilical LAD Extremities:  No clubbing or edema. Neurologic:  Alert and  oriented x4;  grossly nonfocal Skin:  No  obvious rash or bruise. Psych:  Alert and cooperative. Normal mood and affect.     Studies/Results: No results found.    Brewer Hitchman L. Tarri Glenn, MD, MPH 11/14/2022, 8:33 AM

## 2022-11-14 NOTE — Progress Notes (Signed)
Uneventful anesthetic. Report to pacu rn. Vss. Care resumed by rn.

## 2022-11-17 ENCOUNTER — Telehealth: Payer: Self-pay | Admitting: *Deleted

## 2022-11-17 NOTE — Telephone Encounter (Signed)
  Follow up Call-    Row Labels 11/14/2022    7:57 AM  Call back number   Section Header. No data exists in this row.   Post procedure Call Back phone  #   [phone]  Permission to leave phone message   Yes     Patient questions:  Do you have a fever, pain , or abdominal swelling? No. Pain Score  0 *  Have you tolerated food without any problems? Yes.    Have you been able to return to your normal activities? Yes.    Do you have any questions about your discharge instructions: Diet   No. Medications  No. Follow up visit  No.  Do you have questions or concerns about your Care? No.  Actions: * If pain score is 4 or above: No action needed, pain <4.

## 2022-12-15 ENCOUNTER — Encounter: Payer: Self-pay | Admitting: Gastroenterology

## 2023-01-14 ENCOUNTER — Ambulatory Visit: Payer: BC Managed Care – PPO | Admitting: Family Medicine

## 2023-01-14 DIAGNOSIS — F419 Anxiety disorder, unspecified: Secondary | ICD-10-CM

## 2023-01-14 DIAGNOSIS — I10 Essential (primary) hypertension: Secondary | ICD-10-CM | POA: Diagnosis not present

## 2023-01-14 DIAGNOSIS — M1A9XX0 Chronic gout, unspecified, without tophus (tophi): Secondary | ICD-10-CM

## 2023-01-14 DIAGNOSIS — Z130 Encounter for screening for diseases of the blood and blood-forming organs and certain disorders involving the immune mechanism: Secondary | ICD-10-CM

## 2023-01-14 DIAGNOSIS — K219 Gastro-esophageal reflux disease without esophagitis: Secondary | ICD-10-CM

## 2023-01-14 DIAGNOSIS — F32A Depression, unspecified: Secondary | ICD-10-CM

## 2023-01-14 DIAGNOSIS — E782 Mixed hyperlipidemia: Secondary | ICD-10-CM

## 2023-01-14 MED ORDER — ACYCLOVIR 400 MG PO TABS: 400.0000 mg | ORAL_TABLET | Freq: Three times a day (TID) | ORAL | 5 refills | Status: AC

## 2023-01-14 MED ORDER — VENLAFAXINE HCL ER 75 MG PO CP24: 75.0000 mg | ORAL_CAPSULE | Freq: Every day | ORAL | 3 refills | Status: DC

## 2023-01-14 MED ORDER — PANTOPRAZOLE SODIUM 40 MG PO TBEC: 40.0000 mg | DELAYED_RELEASE_TABLET | Freq: Every day | ORAL | 3 refills | Status: DC

## 2023-01-14 MED ORDER — TRIAMTERENE-HCTZ 37.5-25 MG PO CAPS: 1.0000 | ORAL_CAPSULE | Freq: Every day | ORAL | 3 refills | Status: DC

## 2023-01-14 MED ORDER — METOPROLOL SUCCINATE ER 50 MG PO TB24: 50.0000 mg | ORAL_TABLET | Freq: Two times a day (BID) | ORAL | 3 refills | Status: DC

## 2023-01-14 MED ORDER — AMLODIPINE BESYLATE 5 MG PO TABS: 5.0000 mg | ORAL_TABLET | Freq: Two times a day (BID) | ORAL | 3 refills | Status: DC

## 2023-01-14 MED ORDER — ALLOPURINOL 300 MG PO TABS: 300.0000 mg | ORAL_TABLET | Freq: Every day | ORAL | 3 refills | Status: DC

## 2023-01-14 NOTE — Assessment & Plan Note (Addendum)
Stable and Effexor.  Continue.

## 2023-01-14 NOTE — Assessment & Plan Note (Signed)
Well-controlled.  Continue current medications.  Meds refilled today.

## 2023-01-14 NOTE — Patient Instructions (Signed)
I have refilled your medicine.  Labs at [facility].  Follow-up in 6 months.

## 2023-01-14 NOTE — Progress Notes (Signed)
Subjective:  Patient ID: William Roman, male    DOB: 12-26-1970  Age: 52 y.o. MRN: [id number]  CC: Chief Complaint  Patient presents with   Hypertension    6 month follow up   annxiety and depression    Follow up    HPI:  52 year old male with hypertension, GERD, arthritis, chronic kidney disease stage II, anxiety and depression, hyperlipidemia, gout presents for follow-up.  Patient reports that overall he is doing well.  His anxiety and depression is doing well on Effexor.  Patient follows rheumatology regarding rheumatoid arthritis.  He is currently on no pharmacotherapy for this.  Blood pressure well-controlled on amlodipine, metoprolol, and triamterene/HCTZ.  No recent gout flares.  Doing well on allopurinol.  Needs labs today.  Patient Active Problem List   Diagnosis Date Noted   RA (rheumatoid arthritis) (HCC) 07/16/2022   Health care maintenance 07/16/2022   CKD (chronic kidney disease) stage 2, GFR 60-89 ml/min 01/13/2022   Mixed hyperlipidemia 01/13/2022   Gout 09/04/2021   GERD (gastroesophageal reflux disease) 09/04/2021   Anxiety and depression 09/04/2021   Essential hypertension, benign 08/15/2013    Social Hx   Social History   Socioeconomic History   Marital status: Single    Spouse name: Not on file   Number of children: Not on file   Years of education: Not on file   Highest education level: Not on file  Occupational History   Not on file  Tobacco Use   Smoking status: Never   Smokeless tobacco: Never  Vaping Use   Vaping Use: Never used  Substance and Sexual Activity   Alcohol use: No   Drug use: No   Sexual activity: Not on file  Other Topics Concern   Not on file  Social History Narrative   Not on file   Social Determinants of Health   Financial Resource Strain: Not on file  Food Insecurity: Not on file  Transportation Needs: Not on file  Physical Activity: Not on file  Stress: Not on file  Social Connections: Not on  file    Review of Systems  Constitutional: Negative.   Respiratory: Negative.    Cardiovascular: Negative.      Objective:  BP 110/84   Pulse 70   Temp 98.1 F (36.7 C)   Ht 5\' 8"  (1.727 m)   Wt 221 lb (100.2 kg)   SpO2 97%   BMI 33.60 kg/m      01/14/2023    8:27 AM 11/14/2022    9:32 AM 11/14/2022    9:22 AM  BP/Weight  Systolic BP 110 136 130  Diastolic BP 84 86 88  Wt. (Lbs) 221    BMI 33.6 kg/m2      Physical Exam Vitals and nursing note reviewed.  Constitutional:      General: He is not in acute distress.    Appearance: Normal appearance.  HENT:     Head: Normocephalic and atraumatic.  Eyes:     General:        Right eye: No discharge.        Left eye: No discharge.     Conjunctiva/sclera: Conjunctivae normal.  Cardiovascular:     Rate and Rhythm: Normal rate and regular rhythm.  Pulmonary:     Effort: Pulmonary effort is normal.     Breath sounds: Normal breath sounds. No wheezing, rhonchi or rales.  Neurological:     Mental Status: He is alert.  Psychiatric:  Mood and Affect: Mood normal.        Behavior: Behavior normal.     Lab Results  Component Value Date   WBC 6.2 07/16/2022   HGB 17.0 07/16/2022   HCT 47.3 07/16/2022   PLT 195 07/16/2022   GLUCOSE 101 (H) 07/16/2022   CHOL 218 (H) 07/16/2022   TRIG 441 (H) 07/16/2022   HDL 27 (L) 07/16/2022   LDLCALC 114 (H) 07/16/2022   ALT 65 (H) 07/16/2022   AST 36 07/16/2022   NA 141 07/16/2022   K 4.9 07/16/2022   CL 102 07/16/2022   CREATININE 1.27 07/16/2022   BUN 19 07/16/2022   CO2 25 07/16/2022   HGBA1C 5.2 10/16/2021     Assessment & Plan:   Problem List Items Addressed This Visit       Cardiovascular and Mediastinum   Essential hypertension, benign    Well-controlled.  Continue current medications.  Meds refilled today.      Relevant Medications   amLODipine (NORVASC) 5 MG tablet   metoprolol succinate (TOPROL-XL) 50 MG 24 hr tablet    triamterene-hydrochlorothiazide (DYAZIDE) 37.5-25 MG capsule   Other Relevant Orders   CMP14+EGFR     Digestive   GERD (gastroesophageal reflux disease)    Stable on Protonix.  Continue.  Refilled today.      Relevant Medications   pantoprazole (PROTONIX) 40 MG tablet     Other   Anxiety and depression    Stable and Effexor.  Continue.      Relevant Medications   venlafaxine XR (EFFEXOR XR) 75 MG 24 hr capsule   Gout    Allopurinol.  Uric acid level today.      Relevant Orders   Uric Acid   Mixed hyperlipidemia    No pharmacotherapy at this time.  Lipid panel today to assess.      Relevant Medications   amLODipine (NORVASC) 5 MG tablet   metoprolol succinate (TOPROL-XL) 50 MG 24 hr tablet   triamterene-hydrochlorothiazide (DYAZIDE) 37.5-25 MG capsule   Other Relevant Orders   Lipid panel   Other Visit Diagnoses     Screening for deficiency anemia       Relevant Orders   CBC       Meds ordered this encounter  Medications   acyclovir (ZOVIRAX) 400 MG tablet    Sig: Take 1 tablet (400 mg total) by mouth 3 (three) times daily. For 1 week    Dispense:  21 tablet    Refill:  5   allopurinol (ZYLOPRIM) 300 MG tablet    Sig: Take 1 tablet (300 mg total) by mouth daily.    Dispense:  90 tablet    Refill:  3   amLODipine (NORVASC) 5 MG tablet    Sig: Take 1 tablet (5 mg total) by mouth 2 (two) times daily.    Dispense:  180 tablet    Refill:  3   metoprolol succinate (TOPROL-XL) 50 MG 24 hr tablet    Sig: Take 1 tablet (50 mg total) by mouth 2 (two) times daily. Take with or immediately following a meal.    Dispense:  180 tablet    Refill:  3   pantoprazole (PROTONIX) 40 MG tablet    Sig: Take 1 tablet (40 mg total) by mouth daily.    Dispense:  90 tablet    Refill:  3   triamterene-hydrochlorothiazide (DYAZIDE) 37.5-25 MG capsule    Sig: Take 1 each (1 capsule total) by mouth daily.  Dispense:  90 capsule    Refill:  3   venlafaxine XR (EFFEXOR XR) 75  MG 24 hr capsule    Sig: Take 1 capsule (75 mg total) by mouth daily with breakfast.    Dispense:  90 capsule    Refill:  3    Follow-up:  Return in about 6 months (around 07/17/2023).  Everlene Other DO [facility]

## 2023-01-14 NOTE — Assessment & Plan Note (Signed)
Allopurinol.  Uric acid level today.

## 2023-01-14 NOTE — Assessment & Plan Note (Signed)
Stable on Protonix.  Continue.  Refilled today. 

## 2023-01-14 NOTE — Assessment & Plan Note (Signed)
No pharmacotherapy at this time.  Lipid panel today to assess.

## 2023-01-22 DIAGNOSIS — M6530 Trigger finger, unspecified finger: Secondary | ICD-10-CM | POA: Diagnosis not present

## 2023-01-22 DIAGNOSIS — R7989 Other specified abnormal findings of blood chemistry: Secondary | ICD-10-CM | POA: Diagnosis not present

## 2023-01-22 DIAGNOSIS — M109 Gout, unspecified: Secondary | ICD-10-CM | POA: Diagnosis not present

## 2023-01-22 DIAGNOSIS — M0609 Rheumatoid arthritis without rheumatoid factor, multiple sites: Secondary | ICD-10-CM | POA: Diagnosis not present

## 2023-03-27 ENCOUNTER — Other Ambulatory Visit: Payer: Self-pay | Admitting: Family Medicine

## 2023-04-30 DIAGNOSIS — M0609 Rheumatoid arthritis without rheumatoid factor, multiple sites: Secondary | ICD-10-CM | POA: Diagnosis not present

## 2023-04-30 DIAGNOSIS — M6530 Trigger finger, unspecified finger: Secondary | ICD-10-CM | POA: Diagnosis not present

## 2023-04-30 DIAGNOSIS — M109 Gout, unspecified: Secondary | ICD-10-CM | POA: Diagnosis not present

## 2023-04-30 DIAGNOSIS — R7989 Other specified abnormal findings of blood chemistry: Secondary | ICD-10-CM | POA: Diagnosis not present

## 2023-05-09 ENCOUNTER — Other Ambulatory Visit: Payer: Self-pay

## 2023-05-09 ENCOUNTER — Emergency Department (HOSPITAL_BASED_OUTPATIENT_CLINIC_OR_DEPARTMENT_OTHER): Payer: BC Managed Care – PPO | Admitting: Radiology

## 2023-05-09 ENCOUNTER — Emergency Department (HOSPITAL_BASED_OUTPATIENT_CLINIC_OR_DEPARTMENT_OTHER)
Admission: EM | Admit: 2023-05-09 | Discharge: 2023-05-09 | Disposition: A | Discharge status: Home / Self Care | Payer: BC Managed Care – PPO | Attending: Emergency Medicine | Admitting: Emergency Medicine

## 2023-05-09 DIAGNOSIS — I10 Essential (primary) hypertension: Secondary | ICD-10-CM | POA: Insufficient documentation

## 2023-05-09 DIAGNOSIS — R0789 Other chest pain: Secondary | ICD-10-CM | POA: Diagnosis not present

## 2023-05-09 DIAGNOSIS — I4891 Unspecified atrial fibrillation: Secondary | ICD-10-CM | POA: Diagnosis not present

## 2023-05-09 DIAGNOSIS — Z79899 Other long term (current) drug therapy: Secondary | ICD-10-CM | POA: Diagnosis not present

## 2023-05-09 DIAGNOSIS — R079 Chest pain, unspecified: Secondary | ICD-10-CM | POA: Diagnosis not present

## 2023-05-09 LAB — BASIC METABOLIC PANEL
Anion gap: 11 (ref 5–15)
BUN: 17 mg/dL (ref 6–20)
CO2: 24 mmol/L (ref 22–32)
Calcium: 8.8 mg/dL — ABNORMAL LOW (ref 8.9–10.3)
Chloride: 105 mmol/L (ref 98–111)
Creatinine, Ser: 1.26 mg/dL — ABNORMAL HIGH (ref 0.61–1.24)
GFR, Estimated: >60 mL/min (ref >60)
Glucose, Bld: 82 mg/dL (ref 70–99)
Potassium: 3.6 mmol/L (ref 3.5–5.1)
Sodium: 140 mmol/L (ref 135–145)

## 2023-05-09 LAB — CBC
HCT: 45.2 % (ref 39.0–52.0)
Hemoglobin: 16.4 g/dL (ref 13.0–17.0)
MCH: 31.5 pg (ref 26.0–34.0)
MCHC: 36.3 g/dL — ABNORMAL HIGH (ref 30.0–36.0)
MCV: 86.9 fL (ref 80.0–100.0)
Platelets: 227 10*3/uL (ref 150–400)
RBC: 5.2 MIL/uL (ref 4.22–5.81)
RDW: 13.6 % (ref 11.5–15.5)
WBC: 7.7 10*3/uL (ref 4.0–10.5)
nRBC: 0 % (ref 0.0–0.2)

## 2023-05-09 LAB — TROPONIN I (HIGH SENSITIVITY)
Troponin I (High Sensitivity): 16 ng/L (ref <18)
Troponin I (High Sensitivity): 13 ng/L (ref <18)

## 2023-05-09 NOTE — ED Triage Notes (Signed)
Pt c/o chest tightness, SOB, and feeling anxious that started approx 4 hrs ago while doing laundry. Hx afib, reports last time he felt these similar s/s was when he was in afib. Pt states he is not currently on blood thinner.

## 2023-05-09 NOTE — ED Provider Notes (Signed)
EMERGENCY DEPARTMENT AT [facility] Provider Note   CSN: [id number] Arrival date & time: 05/09/23  1251     History  Chief Complaint  Patient presents with   Chest Pain    William Roman is a 52 y.o. male with history of atrial fibrillation, depression, anxiety, hypertension, arthritis, GERD, hyperlipidemia, who presents the emergency department complaining of chest tightness and shortness of breath.  Started about 830 this morning while the patient was doing laundry.  He states that he has had atrial fibrillation before, but about 20 years ago or so.  At that time he did have multiple runs of it.  He at one time had self converted, and another time was put on medication.  He has never taken a anticoagulant.  He most recently saw cardiologist in 2022 for his blood pressure.  He is currently on metoprolol succinate XL 50 mg, amlodipine 5 mg, and Dyazide for his blood pressure.  He is having minimal chest tightness at this time, has not changed very much since this morning.  No pain or swelling in his lower legs, other than when he has been on his feet working all day.   Chest Pain Associated symptoms: shortness of breath        Home Medications Prior to Admission medications   Medication Sig Start Date End Date Taking? Authorizing Provider  acyclovir (ZOVIRAX) 400 MG tablet Take 1 tablet (400 mg total) by mouth 3 (three) times daily. For 1 week 01/14/23   Tommie Sams, DO  allopurinol (ZYLOPRIM) 300 MG tablet Take 1 tablet (300 mg total) by mouth daily. 01/14/23   Tommie Sams, DO  amLODipine (NORVASC) 5 MG tablet Take 1 tablet (5 mg total) by mouth 2 (two) times daily. 01/14/23   Tommie Sams, DO  loratadine (CLARITIN) 10 MG tablet Take 10 mg by mouth daily.    [provider]  metoprolol succinate (TOPROL-XL) 50 MG 24 hr tablet TAKE 1 TABLET(50 MG) BY MOUTH TWICE DAILY WITH OR IMMEDIATELY FOLLOWING A MEAL 03/27/23   Cook, Haskell G, DO  Multiple  Vitamins-Minerals (MULTIVITAMIN WITH MINERALS) tablet Take 1 tablet by mouth daily.    [provider]  pantoprazole (PROTONIX) 40 MG tablet Take 1 tablet (40 mg total) by mouth daily. 01/14/23   Tommie Sams, DO  triamterene-hydrochlorothiazide (DYAZIDE) 37.5-25 MG capsule Take 1 each (1 capsule total) by mouth daily. 01/14/23   Tommie Sams, DO  venlafaxine XR (EFFEXOR XR) 75 MG 24 hr capsule Take 1 capsule (75 mg total) by mouth daily with breakfast. 01/14/23   Tommie Sams, DO      Allergies    Lisinopril, Losartan, and Wellbutrin [bupropion]    Review of Systems   Review of Systems  Respiratory:  Positive for shortness of breath.   Cardiovascular:  Positive for chest pain.  All other systems reviewed and are negative.   Physical Exam Updated Vital Signs BP (!) 128/91   Pulse 82   Temp 97.9 F (36.6 C) (Oral)   Resp 17   Ht 5\' 8"  (1.727 m)   Wt 97.5 kg   SpO2 98%   BMI 32.69 kg/m  Physical Exam Vitals and nursing note reviewed.  Constitutional:      Appearance: Normal appearance.  HENT:     Head: Normocephalic and atraumatic.  Eyes:     Conjunctiva/sclera: Conjunctivae normal.  Cardiovascular:     Rate and Rhythm: Normal rate. Rhythm irregularly irregular.  Pulmonary:  Effort: Pulmonary effort is normal. No respiratory distress.     Breath sounds: Normal breath sounds.  Abdominal:     General: There is no distension.     Palpations: Abdomen is soft.     Tenderness: There is no abdominal tenderness.  Musculoskeletal:     Right lower leg: No edema.     Left lower leg: No edema.  Skin:    General: Skin is warm and dry.  Neurological:     General: No focal deficit present.     Mental Status: He is alert.     ED Results / Procedures / Treatments   Labs (all labs ordered are listed, but only abnormal results are displayed) Labs Reviewed  BASIC METABOLIC PANEL - Abnormal; Notable for the following components:      Result Value   Creatinine, Ser 1.26  (*)    Calcium 8.8 (*)    All other components within normal limits  CBC - Abnormal; Notable for the following components:   MCHC 36.3 (*)    All other components within normal limits  TROPONIN I (HIGH SENSITIVITY)  TROPONIN I (HIGH SENSITIVITY)    EKG EKG Interpretation Date/Time:  Saturday May 09 2023 12:59:06 EDT Ventricular Rate:  91 PR Interval:    QRS Duration:  88 QT Interval:  344 QTC Calculation: 423 R Axis:   36  Text Interpretation: Atrial fibrillation Anterior infarct , age undetermined Abnormal ECG No previous ECGs available Confirmed by Ernie Avena ([id number]) on 05/09/2023 1:51:22 PM  Radiology DG Chest 2 View  Result Date: 05/09/2023 CLINICAL DATA:  CP EXAM: CHEST - 2 VIEW COMPARISON:  03/06/2015. FINDINGS: Cardiac silhouette is unremarkable. No pneumothorax or pleural effusion. The lungs are clear. The visualized skeletal structures are unremarkable. IMPRESSION: No acute cardiopulmonary process. Electronically Signed   By: Layla Maw M.D.   On: 05/09/2023 13:37    Procedures Procedures    Medications Ordered in ED Medications - No data to display  ED Course/ Medical Decision Making/ A&P         CHA2DS2-VASc Score: 1                        Medical Decision Making Amount and/or Complexity of Data Reviewed Labs: ordered. Radiology: ordered.  This patient is a 52 y.o. male  who presents to the ED for concern of chest tightness and shortness of breath.   Differential diagnoses prior to evaluation: The emergent differential diagnosis includes, but is not limited to,  ACS, pericarditis, myocarditis, aortic dissection, PE, pneumothorax, esophageal spasm or rupture, chronic angina, pneumonia, bronchitis, GERD, reflux/PUD, biliary disease, pancreatitis, costochondritis, anxiety. This is not an exhaustive differential.   Past Medical History / Co-morbidities / Social History: atrial fibrillation, depression, anxiety, hypertension, arthritis, GERD,  hyperlipidemia,  Additional history: Chart reviewed. Pertinent results include: Previously saw cardiology in 2022. Documented most recent episode of atrial fibrillation in 2003.   Physical Exam: Physical exam performed. The pertinent findings include: Normal vital signs, no acute distress. Heart rate normal, rhythm irregularly irregular. Normal respiratory effort and clear lungs. No peripheral edema.   Lab Tests/Imaging studies: I personally interpreted labs/imaging and the pertinent results include: CBC and BMP grossly unremarkable.  Troponins flat.  Chest x-ray without acute abnormalities.  I agree with the radiologist interpretation.  Cardiac monitoring: EKG obtained and interpreted by myself and attending physician which shows: Atrial fibrillation, normal rate   Disposition: After consideration of the diagnostic results and the patients  response to treatment, I feel that emergency department workup does not suggest an emergent condition requiring admission or immediate intervention beyond what has been performed at this time. The plan is: discharge to home with close cardiology follow up and close return precautions.  Patient is hemodynamically stable, rate is controlled, not requiring cardioversion.  Symptoms are not severe in nature.  Patient has previously seen a cardiologist, and will follow-up with them. PERC for PE negative, VSS, no tracheal deviation, no JVD or new murmur, breath sounds equal bilaterally, negative troponin, and negative CXR. The patient is safe for discharge and has been instructed to return immediately for worsening symptoms, change in symptoms or any other concerns.  I discussed this case with my attending physician Dr. Karene Fry who cosigned this note including patient's presenting symptoms, physical exam, and planned diagnostics and interventions. Attending physician stated agreement with plan or made changes to plan which were implemented.   Final Clinical  Impression(s) / ED Diagnoses Final diagnoses:  Atrial fibrillation with controlled ventricular rate (HCC)    Rx / DC Orders ED Discharge Orders          Ordered    Ambulatory referral to Cardiology       Comments: If you have not heard from the Cardiology office within the next 72 hours please call [phone].   05/09/23 1643           Portions of this report may have been transcribed using voice recognition software. Every effort was made to ensure accuracy; however, inadvertent computerized transcription errors may be present.    Jeanella Flattery 05/09/23 1647    Ernie Avena, MD 05/09/23 1724

## 2023-05-09 NOTE — Discharge Instructions (Addendum)
You were seen in the ER today for chest tightness.  As we discussed, you are still in atrial fibrillation. This is a condition that can be treated in different ways. One of which is treating the rate and another is treating the rhythm. Your rate has stayed below a concerning level, which is very reassuring. Treating the rhythm is usually done with the assistance of a cardiologist.  We also discussed your risk of stroke, and I do not feel you are requiring initiation of a blood thinner at this time.  Please follow up with the cardiologist as soon as possible. Give their office a call first thing on Monday morning to request a follow up appointment.  Continue to monitor how you're doing and return to the ER for new or worsening symptoms such as worsening chest discomfort, shortness of breath, lightheadedness or passing out.

## 2023-05-12 ENCOUNTER — Ambulatory Visit (INDEPENDENT_AMBULATORY_CARE_PROVIDER_SITE_OTHER): Payer: BC Managed Care – PPO

## 2023-05-12 ENCOUNTER — Encounter: Payer: Self-pay | Admitting: *Deleted

## 2023-05-12 ENCOUNTER — Encounter: Payer: Self-pay | Admitting: Physician Assistant

## 2023-05-12 ENCOUNTER — Ambulatory Visit: Payer: BC Managed Care – PPO | Attending: Physician Assistant | Admitting: Physician Assistant

## 2023-05-12 VITALS — BP 120/82 | HR 92 | Ht 68.0 in | Wt 224.8 lb

## 2023-05-12 DIAGNOSIS — I10 Essential (primary) hypertension: Secondary | ICD-10-CM | POA: Diagnosis not present

## 2023-05-12 DIAGNOSIS — R079 Chest pain, unspecified: Secondary | ICD-10-CM

## 2023-05-12 DIAGNOSIS — R072 Precordial pain: Secondary | ICD-10-CM | POA: Diagnosis not present

## 2023-05-12 DIAGNOSIS — R002 Palpitations: Secondary | ICD-10-CM

## 2023-05-12 DIAGNOSIS — I4891 Unspecified atrial fibrillation: Secondary | ICD-10-CM | POA: Diagnosis not present

## 2023-05-12 DIAGNOSIS — R0602 Shortness of breath: Secondary | ICD-10-CM

## 2023-05-12 MED ORDER — METOPROLOL SUCCINATE ER 50 MG PO TB24: 75.0000 mg | ORAL_TABLET | Freq: Two times a day (BID) | ORAL | 3 refills | Status: DC

## 2023-05-12 MED ORDER — AMLODIPINE BESYLATE 2.5 MG PO TABS: 2.5000 mg | ORAL_TABLET | Freq: Two times a day (BID) | ORAL | 3 refills | Status: DC

## 2023-05-12 MED ORDER — NITROGLYCERIN 0.4 MG SL SUBL: 0.4000 mg | SUBLINGUAL_TABLET | SUBLINGUAL | 3 refills | Status: AC | PRN

## 2023-05-12 NOTE — Patient Instructions (Signed)
Medication Instructions:  Decrease amlodipine to 2.5 mg twice daily Increase metoprolol to 75 mg twice daily, this will be one and one-half of the 50 mg tablet twice daily We sent in an rx for sublingual nitroglycerin for you to take as needed for chest pain *If you need a refill on your cardiac medications before your next appointment, please call your pharmacy*  Lab Work: None ordered If you have labs (blood work) drawn today and your tests are completely normal, you will receive your results only by: MyChart Message (if you have MyChart) OR A paper copy in the mail If you have any lab test that is abnormal or we need to change your treatment, we will call you to review the results.  Testing/Procedures: Your physician has requested that you have an echocardiogram. Echocardiography is a painless test that uses sound waves to create images of your heart. It provides your doctor with information about the size and shape of your heart and how well your heart's chambers and valves are working. This procedure takes approximately one hour. There are no restrictions for this procedure. Please do NOT wear cologne, perfume, aftershave, or lotions (deodorant is allowed). Please arrive 15 minutes prior to your appointment time.   Your physician has requested that you have a lexiscan myoview. For further information please visit [email or web address] Please follow instruction sheet, as given.  ZIO XT- Long Term Monitor Instructions  Your physician has requested you wear a ZIO patch monitor for 14 days.  This is a single patch monitor. [employer] supplies one patch monitor per enrollment. Additional stickers are not available. Please do not apply patch if you will be having a Nuclear Stress Test,  Echocardiogram, Cardiac CT, MRI, or Chest Xray during the period you would be wearing the  monitor. The patch cannot be worn during these tests. You cannot remove and re-apply the  ZIO XT patch monitor.  Your  ZIO patch monitor will be mailed 3 day USPS to your address on file. It may take 3-5 days  to receive your monitor after you have been enrolled.  Once you have received your monitor, please review the enclosed instructions. Your monitor  has already been registered assigning a specific monitor serial # to you.  Billing and Patient Assistance Program Information  We have supplied [employer] with any of your insurance information on file for billing purposes. [employer] offers a sliding scale Patient Assistance Program for patients that do not have  insurance, or whose insurance does not completely cover the cost of the ZIO monitor.  You must apply for the Patient Assistance Program to qualify for this discounted rate.  To apply, please call [employer] at [phone], select option 4, select option 2, ask to apply for  Patient Assistance Program. Meredeth Ide will ask your household income, and how many people  are in your household. They will quote your out-of-pocket cost based on that information.  [employer] will also be able to set up a 9-month, interest-free payment plan if needed.  Applying the monitor   Shave hair from upper left chest.  Hold abrader disc by orange tab. Rub abrader in 40 strokes over the upper left chest as  indicated in your monitor instructions.  Clean area with 4 enclosed alcohol pads. Let dry.  Apply patch as indicated in monitor instructions. Patch will be placed under collarbone on left  side of chest with arrow pointing upward.  Rub patch adhesive wings for 2 minutes. Remove white label marked "1". Remove the  white  label marked "2". Rub patch adhesive wings for 2 additional minutes.  While looking in a mirror, press and release button in center of patch. A small green light will  flash 3-4 times. This will be your only indicator that the monitor has been turned on.  Do not shower for the first 24 hours. You may shower after the first 24 hours.  Press the button if you feel  a symptom. You will hear a small click. Record Date, Time and  Symptom in the Patient Logbook.  When you are ready to remove the patch, follow instructions on the last 2 pages of Patient  Logbook. Stick patch monitor onto the last page of Patient Logbook.  Place Patient Logbook in the blue and white box. Use locking tab on box and tape box closed  securely. The blue and white box has prepaid postage on it. Please place it in the mailbox as  soon as possible. Your physician should have your test results approximately 7 days after the  monitor has been mailed back to [facility].  Call [facility] Customer Care at [phone] if you have questions regarding  your ZIO XT patch monitor. Call them immediately if you see an orange light blinking on your  monitor.  If your monitor falls off in less than 4 days, contact our Monitor department at [phone].  If your monitor becomes loose or falls off after 4 days call [employer] at [phone] for  suggestions on securing your monitor    Follow-Up: At [facility], you and your health needs are our priority.  As part of our continuing mission to provide you with exceptional heart care, we have created designated Provider Care Teams.  These Care Teams include your primary Cardiologist (physician) and Advanced Practice Providers (APPs -  Physician Assistants and Nurse Practitioners) who all work together to provide you with the care you need, when you need it.  Your next appointment:   6 week(s)  Provider:   Jari Favre, PA-C     or another APP  Other Instructions Avoid caffiene Stay hydrated Low-Sodium Eating Plan Salt (sodium) helps you keep a healthy balance of fluids in your body. Too much sodium can raise your blood pressure. It can also cause fluid and waste to be held in your body. Your health care provider or dietitian may recommend a low-sodium eating plan if you have high blood pressure (hypertension), kidney  disease, liver disease, or heart failure. Eating less sodium can help lower your blood pressure and reduce swelling. It can also protect your heart, liver, and kidneys. What are tips for following this plan? Reading food labels  Check food labels for the amount of sodium per serving. If you eat more than one serving, you must multiply the listed amount by the number of servings. Choose foods with less than 140 milligrams (mg) of sodium per serving. Avoid foods with 300 mg of sodium or more per serving. Always check how much sodium is in a product, even if the label says "unsalted" or "no salt added." Shopping  Buy products labeled as "low-sodium" or "no salt added." Buy fresh foods. Avoid canned foods and pre-made or frozen meals. Avoid canned, cured, or processed meats. Buy breads that have less than 80 mg of sodium per slice. Cooking  Eat more home-cooked food. Try to eat less restaurant, buffet, and fast food. Try not to add salt when you cook. Use salt-free seasonings or herbs instead of table salt or sea  salt. Check with your provider or pharmacist before using salt substitutes. Cook with plant-based oils, such as canola, sunflower, or olive oil. Meal planning When eating at a restaurant, ask if your food can be made with less salt or no salt. Avoid dishes labeled as brined, pickled, cured, or smoked. Avoid dishes made with soy sauce, miso, or teriyaki sauce. Avoid foods that have monosodium glutamate (MSG) in them. MSG may be added to some restaurant food, sauces, soups, bouillon, and canned foods. Make meals that can be grilled, baked, poached, roasted, or steamed. These are often made with less sodium. General information Try to limit your sodium intake to 1,500-2,300 mg each day, or the amount told by your provider. What foods should I eat? Fruits Fresh, frozen, or canned fruit. Fruit juice. Vegetables Fresh or frozen vegetables. "No salt added" canned vegetables. "No salt  added" tomato sauce and paste. Low-sodium or reduced-sodium tomato and vegetable juice. Grains Low-sodium cereals, such as oats, puffed wheat and rice, and shredded wheat. Low-sodium crackers. Unsalted rice. Unsalted pasta. Low-sodium bread. Whole grain breads and whole grain pasta. Meats and other proteins Fresh or frozen meat, poultry, seafood, and fish. These should have no added salt. Low-sodium canned tuna and salmon. Unsalted nuts. Dried peas, beans, and lentils without added salt. Unsalted canned beans. Eggs. Unsalted nut butters. Dairy Milk. Soy milk. Cheese that is naturally low in sodium, such as ricotta cheese, fresh mozzarella, or Swiss cheese. Low-sodium or reduced-sodium cheese. Cream cheese. Yogurt. Seasonings and condiments Fresh and dried herbs and spices. Salt-free seasonings. Low-sodium mustard and ketchup. Sodium-free salad dressing. Sodium-free light mayonnaise. Fresh or refrigerated horseradish. Lemon juice. Vinegar. Other foods Homemade, reduced-sodium, or low-sodium soups. Unsalted popcorn and pretzels. Low-salt or salt-free chips. The items listed above may not be all the foods and drinks you can have. Talk to a dietitian to learn more. What foods should I avoid? Vegetables Sauerkraut, pickled vegetables, and relishes. Olives. [place] fries. Onion rings. Regular canned vegetables, except low-sodium or reduced-sodium items. Regular canned tomato sauce and paste. Regular tomato and vegetable juice. Frozen vegetables in sauces. Grains Instant hot cereals. Bread stuffing, pancake, and biscuit mixes. Croutons. Seasoned rice or pasta mixes. Noodle soup cups. Boxed or frozen macaroni and cheese. Regular salted crackers. Self-rising flour. Meats and other proteins Meat or fish that is salted, canned, smoked, spiced, or pickled. Precooked or cured meat, such as sausages or meat loaves. Tomasa Blase. Ham. Pepperoni. Hot dogs. Corned beef. Chipped beef. Salt pork. Jerky. Pickled herring,  anchovies, and sardines. Regular canned tuna. Salted nuts. Dairy Processed cheese and cheese spreads. Hard cheeses. Cheese curds. Blue cheese. Feta cheese. String cheese. Regular cottage cheese. Buttermilk. Canned milk. Fats and oils Salted butter. Regular margarine. Ghee. Bacon fat. Seasonings and condiments Onion salt, garlic salt, seasoned salt, table salt, and sea salt. Canned and packaged gravies. Worcestershire sauce. Tartar sauce. Barbecue sauce. Teriyaki sauce. Soy sauce, including reduced-sodium soy sauce. Steak sauce. Fish sauce. Oyster sauce. Cocktail sauce. Horseradish that you find on the shelf. Regular ketchup and mustard. Meat flavorings and tenderizers. Bouillon cubes. Hot sauce. Pre-made or packaged marinades. Pre-made or packaged taco seasonings. Relishes. Regular salad dressings. Salsa. Other foods Salted popcorn and pretzels. Corn chips and puffs. Potato and tortilla chips. Canned or dried soups. Pizza. Frozen entrees and pot pies. The items listed above may not be all the foods and drinks you should avoid. Talk to a dietitian to learn more. This information is not intended to replace advice given to you by  your health care provider. Make sure you discuss any questions you have with your health care provider. Document Revised: 09/18/2022 Document Reviewed: 09/18/2022 Elsevier Patient Education  2024 Elsevier Inc. Heart-Healthy Eating Plan Many factors influence your heart health, including eating and exercise habits. Heart health is also called coronary health. Coronary risk increases with abnormal blood fat (lipid) levels. A heart-healthy eating plan includes limiting unhealthy fats, increasing healthy fats, limiting salt (sodium) intake, and making other diet and lifestyle changes. What is my plan? Your health care provider may recommend that: You limit your fat intake to _________% or less of your total calories each day. You limit your saturated fat intake to _________% or  less of your total calories each day. You limit the amount of cholesterol in your diet to less than _________ mg per day. You limit the amount of sodium in your diet to less than _________ mg per day. What are tips for following this plan? Cooking Cook foods using methods other than frying. Baking, boiling, grilling, and broiling are all good options. Other ways to reduce fat include: Removing the skin from poultry. Removing all visible fats from meats. Steaming vegetables in water or broth. Meal planning  At meals, imagine dividing your plate into fourths: Fill one-half of your plate with vegetables and green salads. Fill one-fourth of your plate with whole grains. Fill one-fourth of your plate with lean protein foods. Eat 2-4 cups of vegetables per day. One cup of vegetables equals 1 cup (91 g) broccoli or cauliflower florets, 2 medium carrots, 1 large bell pepper, 1 large sweet potato, 1 large tomato, 1 medium white potato, 2 cups (150 g) raw leafy greens. Eat 1-2 cups of fruit per day. One cup of fruit equals 1 small apple, 1 large banana, 1 cup (237 g) mixed fruit, 1 large orange,  cup (82 g) dried fruit, 1 cup (240 mL) 100% fruit juice. Eat more foods that contain soluble fiber. Examples include apples, broccoli, carrots, beans, peas, and barley. Aim to get 25-30 g of fiber per day. Increase your consumption of legumes, nuts, and seeds to 4-5 servings per week. One serving of dried beans or legumes equals  cup (90 g) cooked, 1 serving of nuts is  oz (12 almonds, 24 pistachios, or 7 walnut halves), and 1 serving of seeds equals  oz (8 g). Fats Choose healthy fats more often. Choose monounsaturated and polyunsaturated fats, such as olive and canola oils, avocado oil, flaxseeds, walnuts, almonds, and seeds. Eat more omega-3 fats. Choose salmon, mackerel, sardines, tuna, flaxseed oil, and ground flaxseeds. Aim to eat fish at least 2 times each week. Check food labels carefully to  identify foods with trans fats or high amounts of saturated fat. Limit saturated fats. These are found in animal products, such as meats, butter, and cream. Plant sources of saturated fats include palm oil, palm kernel oil, and coconut oil. Avoid foods with partially hydrogenated oils in them. These contain trans fats. Examples are stick margarine, some tub margarines, cookies, crackers, and other baked goods. Avoid fried foods. General information Eat more home-cooked food and less restaurant, buffet, and fast food. Limit or avoid alcohol. Limit foods that are high in added sugar and simple starches such as foods made using white refined flour (white breads, pastries, sweets). Lose weight if you are overweight. Losing just 5-10% of your body weight can help your overall health and prevent diseases such as diabetes and heart disease. Monitor your sodium intake, especially if you have  high blood pressure. Talk with your health care provider about your sodium intake. Try to incorporate more vegetarian meals weekly. What foods should I eat? Fruits All fresh, canned (in natural juice), or frozen fruits. Vegetables Fresh or frozen vegetables (raw, steamed, roasted, or grilled). Green salads. Grains Most grains. Choose whole wheat and whole grains most of the time. Rice and pasta, including brown rice and pastas made with whole wheat. Meats and other proteins Lean, well-trimmed beef, veal, pork, and lamb. Chicken and [place] without skin. All fish and shellfish. Wild duck, rabbit, pheasant, and venison. Egg whites or low-cholesterol egg substitutes. Dried beans, peas, lentils, and tofu. Seeds and most nuts. Dairy Low-fat or nonfat cheeses, including ricotta and mozzarella. Skim or 1% milk (liquid, powdered, or evaporated). Buttermilk made with low-fat milk. Nonfat or low-fat yogurt. Fats and oils Non-hydrogenated (trans-free) margarines. Vegetable oils, including soybean, sesame, sunflower, olive,  avocado, peanut, safflower, corn, canola, and cottonseed. Salad dressings or mayonnaise made with a vegetable oil. Beverages Water (mineral or sparkling). Coffee and tea. Unsweetened ice tea. Diet beverages. Sweets and desserts Sherbet, gelatin, and fruit ice. Small amounts of dark chocolate. Limit all sweets and desserts. Seasonings and condiments All seasonings and condiments. The items listed above may not be a complete list of foods and beverages you can eat. Contact a dietitian for more options. What foods should I avoid? Fruits Canned fruit in heavy syrup. Fruit in cream or butter sauce. Fried fruit. Limit coconut. Vegetables Vegetables cooked in cheese, cream, or butter sauce. Fried vegetables. Grains Breads made with saturated or trans fats, oils, or whole milk. Croissants. Sweet rolls. Donuts. High-fat crackers, such as cheese crackers and chips. Meats and other proteins Fatty meats, such as hot dogs, ribs, sausage, bacon, rib-eye roast or steak. High-fat deli meats, such as salami and bologna. Caviar. Domestic duck and goose. Organ meats, such as liver. Dairy Cream, sour cream, cream cheese, and creamed cottage cheese. Whole-milk cheeses. Whole or 2% milk (liquid, evaporated, or condensed). Whole buttermilk. Cream sauce or high-fat cheese sauce. Whole-milk yogurt. Fats and oils Meat fat, or shortening. Cocoa butter, hydrogenated oils, palm oil, coconut oil, palm kernel oil. Solid fats and shortenings, including bacon fat, salt pork, lard, and butter. Nondairy cream substitutes. Salad dressings with cheese or sour cream. Beverages Regular sodas and any drinks with added sugar. Sweets and desserts Frosting. Pudding. Cookies. Cakes. Pies. Milk chocolate or white chocolate. Buttered syrups. Full-fat ice cream or ice cream drinks. The items listed above may not be a complete list of foods and beverages to avoid. Contact a dietitian for more information. Summary Heart-healthy meal  planning includes limiting unhealthy fats, increasing healthy fats, limiting salt (sodium) intake and making other diet and lifestyle changes. Lose weight if you are overweight. Losing just 5-10% of your body weight can help your overall health and prevent diseases such as diabetes and heart disease. Focus on eating a balance of foods, including fruits and vegetables, low-fat or nonfat dairy, lean protein, nuts and legumes, whole grains, and heart-healthy oils and fats. This information is not intended to replace advice given to you by your health care provider. Make sure you discuss any questions you have with your health care provider. Document Revised: 10/07/2021 Document Reviewed: 10/07/2021 Elsevier Patient Education  2024 [employer].

## 2023-05-12 NOTE — Progress Notes (Unsigned)
Applied a 14 day Zio XT monitor to patient in the office  Ross to read

## 2023-05-12 NOTE — Progress Notes (Signed)
Cardiology Office Note:  .   Date:  05/12/2023  ID:  William Roman, DOB 10-14-1970, MRN [id number] PCP: Tommie Sams, DO  [facility] Providers Cardiologist:  None {  History of Present Illness: .   William Roman is a 52 y.o. male with a past medical history of HTN and PAF here for follow-up appointment.  Was seen in the clinic by Dr. Ladona Ridgel and blood pressure was high.  Was on Cardizem and Maxide.  Did not tolerate lisinopril or losartan in the past.  Patient last saw Dr. Tenny Craw 06/2021 and at that time stated blood pressure was better.  However, at that appointment blood pressure was the highest it had been, 158/100.  Patient denies chest pain and no shortness of breath.  Does some walking at work.  No palpitations.  Regarding atrial fibrillation only episode was back in 2003.  Very symptomatic at bedtime.  Attributed to caffeine.  None since.  Occasionally does snore but, he has not been told that he stops breathing during sleep.  Today, he tells me that Saturday morning he had chest pain initially and then some shortness of breath that happened later in the day.  It occurs all the time.  Nonexertional.  Today, he had to do a lot of walking and the pain is in the center of his chest but also radiates to the collarbone and neck on the left side.  Shoulder is also hurting.  He usually does a cake cup of 8 ounces of coffee in the morning.  The week prior he started a metabolism energy pill that has vitamins and caffeine which he thinks is 200 mg.  He took it all week and then unfortunately had the symptoms on Saturday.  Suggested stopping the supplement.  We discussed further workup including stress test, echocardiogram and increase in his metoprolol due to his history of atrial fibrillation and recent reoccurrence.   Reports no shortness of breath nor dyspnea on exertion. Reports no chest pain, pressure, or tightness. No edema, orthopnea, PND. Reports no palpitations.    ROS: pertinent ROS in HPI  Studies Reviewed: Marland Kitchen        No previous testing reviewed today.  Patient states he had a Lexiscan Myoview back in 2022 but I cannot find these records.   Risk Assessment/Calculations:    CHA2DS2-VASc Score = 1   This indicates a 0.6% annual risk of stroke. The patient's score is based upon: CHF History: 0 HTN History: 1 Diabetes History: 0 Stroke History: 0 Vascular Disease History: 0 Age Score: 0 Gender Score: 0             Physical Exam:   VS:  BP 120/82   Pulse 92   Ht 5\' 8"  (1.727 m)   Wt 224 lb 12.8 oz (102 kg)   SpO2 98%   BMI 34.18 kg/m    Wt Readings from Last 3 Encounters:  05/12/23 224 lb 12.8 oz (102 kg)  05/09/23 215 lb (97.5 kg)  01/14/23 221 lb (100.2 kg)    GEN: Well nourished, well developed in no acute distress NECK: No JVD; No carotid bruits CARDIAC: RRR, no murmurs, rubs, gallops RESPIRATORY:  Clear to auscultation without rales, wheezing or rhonchi  ABDOMEN: Soft, non-tender, non-distended EXTREMITIES:  No edema; No deformity   ASSESSMENT AND PLAN: .   1. Chest pain/SOB/palpitations -Placed a monitor on the patient today -Luckily, out of atrial fibrillation and back in normal sinus rhythm, rate 92 -Since his  rate is elevated today we increased his metoprolol succinate to 75 twice a day -Since blood pressure was perfect we decreased his amlodipine to 2.5 mg twice a day -We also ordered an echocardiogram and a Lexiscan Myoview for further workup  2.  Hx of Afib -Normal sinus rhythm today -CHA2DS2-VASc score is 1 so less than 1% chance of annual stroke -Did not start any anticoagulation today   3. HTN -Blood pressure well-controlled today -We decreased his amlodipine to 2.5 mg twice a day since we were increasing his metoprolol succinate to 75 mg twice a day -Encouraged him to keep track of his blood pressure at home -Continue low-sodium, heart healthy diet      Dispo: He will follow-up in 6 weeks to  review testing  Signed, Sharlene Dory, PA-C

## 2023-05-20 ENCOUNTER — Telehealth: Payer: Self-pay

## 2023-05-20 DIAGNOSIS — R072 Precordial pain: Secondary | ICD-10-CM | POA: Diagnosis not present

## 2023-05-20 DIAGNOSIS — I4891 Unspecified atrial fibrillation: Secondary | ICD-10-CM | POA: Diagnosis not present

## 2023-05-20 NOTE — Telephone Encounter (Signed)
Transition Care Management Follow-up Telephone Call Date of discharge and from where: 05/09/2023 [facility] How have you been since you were released from the hospital? Patient stated he is feeling much better. Any questions or concerns? No  Items Reviewed: Did the pt receive and understand the discharge instructions provided? Yes  Medications obtained and verified?  No medication prescribed. Other? No  Any new allergies since your discharge? No  Dietary orders reviewed? Yes Do you have support at home? Yes   Follow up appointments reviewed:  PCP Hospital f/u appt confirmed? No  Scheduled to see  on  @ . Specialist Hospital f/u appt confirmed? Yes  Scheduled to see Tessa N. Asa Lente, PA-C on 06/26/2023 @  [facility]. Are transportation arrangements needed? No  If their condition worsens, is the pt aware to call PCP or go to the Emergency Dept.? Yes Was the patient provided with contact information for the PCP's office or ED? Yes Was to pt encouraged to call back with questions or concerns? Yes  [facility]  [facility] Population Health Community Resource Care Guide   ??[email or web address]  ?? [phone]   Website: [email or web address]  [email or web address]

## 2023-05-28 ENCOUNTER — Encounter (HOSPITAL_COMMUNITY): Payer: Self-pay

## 2023-06-03 ENCOUNTER — Ambulatory Visit (HOSPITAL_BASED_OUTPATIENT_CLINIC_OR_DEPARTMENT_OTHER): Payer: BC Managed Care – PPO

## 2023-06-03 ENCOUNTER — Ambulatory Visit (HOSPITAL_COMMUNITY): Payer: BC Managed Care – PPO | Attending: Cardiovascular Disease

## 2023-06-03 DIAGNOSIS — R072 Precordial pain: Secondary | ICD-10-CM | POA: Diagnosis not present

## 2023-06-03 LAB — MYOCARDIAL PERFUSION IMAGING
Estimated workload: 1
Exercise duration (min): 1 min
Exercise duration (sec): 0 s
LV dias vol: 88 mL (ref 62–150)
LV sys vol: 34 mL
MPHR: 171 {beats}/min
Nuc Stress EF: 61 %
Peak HR: 100 {beats}/min
Percent HR: 58 %
Rest HR: 56 {beats}/min
Rest Nuclear Isotope Dose: 10.8 mCi
SDS: 1
SRS: 0
SSS: 1
ST Depression (mm): 0 mm
Stress Nuclear Isotope Dose: 32.8 mCi
TID: 0.99

## 2023-06-03 LAB — ECHOCARDIOGRAM COMPLETE
Area-P 1/2: 4.07 cm2
Height: 68 in
S' Lateral: 3 cm
Weight: 3584 [oz_av]

## 2023-06-03 MED ORDER — REGADENOSON 0.4 MG/5ML IV SOLN: 0.4000 mg | Freq: Once | INTRAVENOUS | Status: DC

## 2023-06-03 MED ORDER — TECHNETIUM TC 99M TETROFOSMIN IV KIT
10.8000 | PACK | Freq: Once | INTRAVENOUS | Status: AC | PRN
  Administered 2023-06-03: 10.8 via INTRAVENOUS

## 2023-06-03 MED ORDER — TECHNETIUM TC 99M TETROFOSMIN IV KIT: 32.8000 | PACK | Freq: Once | INTRAVENOUS | Status: DC | PRN

## 2023-06-26 ENCOUNTER — Ambulatory Visit: Payer: BC Managed Care – PPO | Admitting: Physician Assistant

## 2023-07-17 ENCOUNTER — Ambulatory Visit: Payer: BC Managed Care – PPO | Admitting: Family Medicine

## 2023-07-17 VITALS — BP 144/80 | HR 78 | Ht 68.0 in | Wt 227.4 lb

## 2023-07-17 DIAGNOSIS — I4891 Unspecified atrial fibrillation: Secondary | ICD-10-CM | POA: Insufficient documentation

## 2023-07-17 DIAGNOSIS — Z23 Encounter for immunization: Secondary | ICD-10-CM

## 2023-07-17 DIAGNOSIS — E782 Mixed hyperlipidemia: Secondary | ICD-10-CM

## 2023-07-17 DIAGNOSIS — L918 Other hypertrophic disorders of the skin: Secondary | ICD-10-CM | POA: Diagnosis not present

## 2023-07-17 DIAGNOSIS — R7309 Other abnormal glucose: Secondary | ICD-10-CM

## 2023-07-17 DIAGNOSIS — I10 Essential (primary) hypertension: Secondary | ICD-10-CM | POA: Diagnosis not present

## 2023-07-17 DIAGNOSIS — E66811 Obesity, class 1: Secondary | ICD-10-CM | POA: Insufficient documentation

## 2023-07-17 DIAGNOSIS — M1A9XX0 Chronic gout, unspecified, without tophus (tophi): Secondary | ICD-10-CM

## 2023-07-17 DIAGNOSIS — I480 Paroxysmal atrial fibrillation: Secondary | ICD-10-CM

## 2023-07-17 MED ORDER — SEMAGLUTIDE-WEIGHT MANAGEMENT 0.25 MG/0.5ML ~~LOC~~ SOAJ: 0.2500 mg | SUBCUTANEOUS | 0 refills | Status: DC

## 2023-07-17 NOTE — Assessment & Plan Note (Signed)
Recent medication changes by cardiology.  No changes today.  Advised to monitor his blood pressure closely.  Continue current medications.

## 2023-07-17 NOTE — Assessment & Plan Note (Signed)
Back in sinus rhythm. No anticoagulation.  Continue metoprolol.

## 2023-07-17 NOTE — Patient Instructions (Addendum)
Labs today.  [facility] sent in.  Referral placed.  Follow up in 6 months.

## 2023-07-17 NOTE — Assessment & Plan Note (Signed)
We will see if can get [facility] covered.

## 2023-07-17 NOTE — Assessment & Plan Note (Signed)
Lipid today to assess.

## 2023-07-17 NOTE — Progress Notes (Signed)
Subjective:  Patient ID: William Roman, male    DOB: Aug 25, 1971  Age: 52 y.o. MRN: [id number]  CC: Follow-up   HPI:  52 year old male presents for follow-up.  Recent issues with A-fib.  Has been seen by cardiology.  Metoprolol was increased.  He is back in normal sinus rhythm.  No anticoagulation given CHA2DS2-VASc score of 1.   BP mildly elevated here today.  He is on amlodipine, metoprolol, and triamterene/HCTZ.  Patient reports that he has a lot of skin tags that he wants removed.  He would like to see dermatology.  Will place referral.  Patient needs to get his labs done.  Will order today.  Patient also reports that he is having difficulty with his weight.  He has tried dietary changes and has not had much success.  He tries to stay active but his rheumatoid arthritis limits this.  Patient Active Problem List   Diagnosis Date Noted   Obesity (BMI 30.0-34.9) 07/17/2023   A-fib (HCC) 07/17/2023   RA (rheumatoid arthritis) (HCC) 07/16/2022   Health care maintenance 07/16/2022   CKD (chronic kidney disease) stage 2, GFR 60-89 ml/min 01/13/2022   Mixed hyperlipidemia 01/13/2022   Gout 09/04/2021   GERD (gastroesophageal reflux disease) 09/04/2021   Anxiety and depression 09/04/2021   Essential hypertension, benign 08/15/2013    Social Hx   Social History   Socioeconomic History   Marital status: Single    Spouse name: Not on file   Number of children: Not on file   Years of education: Not on file   Highest education level: Not on file  Occupational History   Not on file  Tobacco Use   Smoking status: Never   Smokeless tobacco: Never  Vaping Use   Vaping status: Never Used  Substance and Sexual Activity   Alcohol use: No   Drug use: No   Sexual activity: Not on file  Other Topics Concern   Not on file  Social History Narrative   Not on file   Social Determinants of Health   Financial Resource Strain: Not on file  Food Insecurity: Not on file   Transportation Needs: Not on file  Physical Activity: Not on file  Stress: Not on file  Social Connections: Not on file    Review of Systems Per HPI  Objective:  BP (!) 144/80   Pulse 78   Ht 5\' 8"  (1.727 m)   Wt 227 lb 6.4 oz (103.1 kg)   SpO2 96%   BMI 34.58 kg/m      07/17/2023    9:04 AM 07/17/2023    8:32 AM 06/03/2023    7:00 AM  BP/Weight  Systolic BP 144 148   Diastolic BP 80 84   Wt. (Lbs)  227.4 224  BMI  34.58 kg/m2 34.06 kg/m2    Physical Exam Vitals and nursing note reviewed.  Constitutional:      Appearance: Normal appearance. He is obese.  HENT:     Head: Normocephalic and atraumatic.  Eyes:     General:        Right eye: No discharge.        Left eye: No discharge.     Conjunctiva/sclera: Conjunctivae normal.  Cardiovascular:     Rate and Rhythm: Normal rate and regular rhythm.  Pulmonary:     Effort: Pulmonary effort is normal.     Breath sounds: Normal breath sounds. No wheezing, rhonchi or rales.  Skin:    Comments: Multiple skin tags  noted around the neck.  He also has skin tags noted on and around the eyelids.  Neurological:     Mental Status: He is alert.  Psychiatric:        Mood and Affect: Mood normal.        Behavior: Behavior normal.     Lab Results  Component Value Date   WBC 7.7 05/09/2023   HGB 16.4 05/09/2023   HCT 45.2 05/09/2023   PLT 227 05/09/2023   GLUCOSE 82 05/09/2023   CHOL 218 (H) 07/16/2022   TRIG 441 (H) 07/16/2022   HDL 27 (L) 07/16/2022   LDLCALC 114 (H) 07/16/2022   ALT 65 (H) 07/16/2022   AST 36 07/16/2022   NA 140 05/09/2023   K 3.6 05/09/2023   CL 105 05/09/2023   CREATININE 1.26 (H) 05/09/2023   BUN 17 05/09/2023   CO2 24 05/09/2023   HGBA1C 5.2 10/16/2021     Assessment & Plan:   Problem List Items Addressed This Visit       Cardiovascular and Mediastinum   Essential hypertension, benign - Primary    Recent medication changes by cardiology.  No changes today.  Advised to monitor his  blood pressure closely.  Continue current medications.      A-fib (HCC)    Back in sinus rhythm. No anticoagulation.  Continue metoprolol.        Other   Gout   Relevant Orders   Uric Acid   Obesity (BMI 30.0-34.9)    We will see if can get [facility] covered.       Mixed hyperlipidemia    Lipid today to assess.       Relevant Orders   Lipid panel   Other Visit Diagnoses     Needs flu shot       Relevant Orders   Flu vaccine trivalent PF, 6mos and older(Flulaval,Afluria,Fluarix,Fluzone) (Completed)   Skin tags, multiple acquired       Relevant Orders   Ambulatory referral to Dermatology   Elevated glucose       Relevant Orders   Hemoglobin A1c       Meds ordered this encounter  Medications   Semaglutide-Weight Management 0.25 MG/0.5ML SOAJ    Sig: Inject 0.25 mg into the skin once a week.    Dispense:  2 mL    Refill:  0    Follow-up:  Return in about 6 months (around 01/14/2024).  Everlene Other DO [facility]

## 2023-07-18 LAB — LIPID PANEL
Chol/HDL Ratio: 8.1 ratio — ABNORMAL HIGH (ref 0.0–5.0)
Cholesterol, Total: 234 mg/dL — ABNORMAL HIGH (ref 100–199)
HDL: 29 mg/dL — ABNORMAL LOW (ref >39)
LDL Chol Calc (NIH): 140 mg/dL — ABNORMAL HIGH (ref 0–99)
Triglycerides: 352 mg/dL — ABNORMAL HIGH (ref 0–149)
VLDL Cholesterol Cal: 65 mg/dL — ABNORMAL HIGH (ref 5–40)

## 2023-07-18 LAB — HEMOGLOBIN A1C
Est. average glucose Bld gHb Est-mCnc: 123 mg/dL
Hgb A1c MFr Bld: 5.9 % — ABNORMAL HIGH (ref 4.8–5.6)

## 2023-07-18 LAB — URIC ACID: Uric Acid: 5.9 mg/dL (ref 3.8–8.4)

## 2023-07-20 ENCOUNTER — Other Ambulatory Visit: Payer: Self-pay | Admitting: Family Medicine

## 2023-07-20 MED ORDER — ROSUVASTATIN CALCIUM 10 MG PO TABS: 10.0000 mg | ORAL_TABLET | Freq: Every day | ORAL | 3 refills | Status: DC

## 2023-07-30 DIAGNOSIS — M6530 Trigger finger, unspecified finger: Secondary | ICD-10-CM | POA: Diagnosis not present

## 2023-07-30 DIAGNOSIS — M65312 Trigger thumb, left thumb: Secondary | ICD-10-CM | POA: Diagnosis not present

## 2023-07-30 DIAGNOSIS — M109 Gout, unspecified: Secondary | ICD-10-CM | POA: Diagnosis not present

## 2023-07-30 DIAGNOSIS — M79642 Pain in left hand: Secondary | ICD-10-CM | POA: Diagnosis not present

## 2023-07-30 DIAGNOSIS — M79641 Pain in right hand: Secondary | ICD-10-CM | POA: Diagnosis not present

## 2023-07-30 DIAGNOSIS — M0609 Rheumatoid arthritis without rheumatoid factor, multiple sites: Secondary | ICD-10-CM | POA: Diagnosis not present

## 2023-07-30 DIAGNOSIS — M65342 Trigger finger, left ring finger: Secondary | ICD-10-CM | POA: Diagnosis not present

## 2023-07-30 DIAGNOSIS — M65321 Trigger finger, right index finger: Secondary | ICD-10-CM | POA: Diagnosis not present

## 2023-07-30 DIAGNOSIS — R7989 Other specified abnormal findings of blood chemistry: Secondary | ICD-10-CM | POA: Diagnosis not present

## 2023-07-30 DIAGNOSIS — M65311 Trigger thumb, right thumb: Secondary | ICD-10-CM | POA: Diagnosis not present

## 2023-08-12 ENCOUNTER — Other Ambulatory Visit: Payer: Self-pay | Admitting: Family Medicine

## 2023-08-31 DIAGNOSIS — M65342 Trigger finger, left ring finger: Secondary | ICD-10-CM | POA: Diagnosis not present

## 2023-08-31 DIAGNOSIS — M65311 Trigger thumb, right thumb: Secondary | ICD-10-CM | POA: Diagnosis not present

## 2023-08-31 DIAGNOSIS — M65321 Trigger finger, right index finger: Secondary | ICD-10-CM | POA: Diagnosis not present

## 2023-10-03 ENCOUNTER — Other Ambulatory Visit: Payer: Self-pay | Admitting: Family Medicine

## 2023-10-05 ENCOUNTER — Other Ambulatory Visit: Payer: Self-pay

## 2023-10-05 MED ORDER — SEMAGLUTIDE-WEIGHT MANAGEMENT 0.25 MG/0.5ML ~~LOC~~ SOAJ: 0.2500 mg | SUBCUTANEOUS | 0 refills | Status: DC

## 2023-10-20 ENCOUNTER — Other Ambulatory Visit: Payer: Self-pay | Admitting: Family Medicine

## 2023-12-30 DIAGNOSIS — G5602 Carpal tunnel syndrome, left upper limb: Secondary | ICD-10-CM | POA: Diagnosis not present

## 2024-01-04 DIAGNOSIS — G5602 Carpal tunnel syndrome, left upper limb: Secondary | ICD-10-CM | POA: Diagnosis not present

## 2024-01-04 DIAGNOSIS — G5622 Lesion of ulnar nerve, left upper limb: Secondary | ICD-10-CM | POA: Diagnosis not present

## 2024-01-11 DIAGNOSIS — M65312 Trigger thumb, left thumb: Secondary | ICD-10-CM | POA: Diagnosis not present

## 2024-01-11 DIAGNOSIS — G5602 Carpal tunnel syndrome, left upper limb: Secondary | ICD-10-CM | POA: Diagnosis not present

## 2024-01-11 DIAGNOSIS — G5622 Lesion of ulnar nerve, left upper limb: Secondary | ICD-10-CM | POA: Diagnosis not present

## 2024-01-14 ENCOUNTER — Other Ambulatory Visit (HOSPITAL_COMMUNITY): Payer: Self-pay

## 2024-01-14 ENCOUNTER — Encounter: Payer: Self-pay | Admitting: Family Medicine

## 2024-01-14 ENCOUNTER — Telehealth: Payer: Self-pay | Admitting: Pharmacist

## 2024-01-14 ENCOUNTER — Telehealth: Payer: Self-pay | Admitting: Pharmacy Technician

## 2024-01-14 ENCOUNTER — Ambulatory Visit: Payer: BC Managed Care – PPO | Admitting: Family Medicine

## 2024-01-14 VITALS — BP 148/92 | HR 63 | Wt 231.6 lb

## 2024-01-14 DIAGNOSIS — E782 Mixed hyperlipidemia: Secondary | ICD-10-CM

## 2024-01-14 DIAGNOSIS — Z6835 Body mass index (BMI) 35.0-35.9, adult: Secondary | ICD-10-CM

## 2024-01-14 DIAGNOSIS — E66811 Obesity, class 1: Secondary | ICD-10-CM | POA: Diagnosis not present

## 2024-01-14 DIAGNOSIS — R7303 Prediabetes: Secondary | ICD-10-CM | POA: Insufficient documentation

## 2024-01-14 DIAGNOSIS — I10 Essential (primary) hypertension: Secondary | ICD-10-CM

## 2024-01-14 MED ORDER — SEMAGLUTIDE-WEIGHT MANAGEMENT 0.25 MG/0.5ML ~~LOC~~ SOAJ: 0.2500 mg | SUBCUTANEOUS | 0 refills | Status: AC

## 2024-01-14 MED ORDER — SEMAGLUTIDE-WEIGHT MANAGEMENT 1.7 MG/0.75ML ~~LOC~~ SOAJ: 1.7000 mg | SUBCUTANEOUS | 0 refills | Status: AC

## 2024-01-14 MED ORDER — SEMAGLUTIDE-WEIGHT MANAGEMENT 0.5 MG/0.5ML ~~LOC~~ SOAJ
0.5000 mg | SUBCUTANEOUS | 0 refills | Status: AC
Start: 2024-02-12 — End: 2024-03-11

## 2024-01-14 MED ORDER — SEMAGLUTIDE-WEIGHT MANAGEMENT 2.4 MG/0.75ML ~~LOC~~ SOAJ: 2.4000 mg | SUBCUTANEOUS | 3 refills | Status: AC

## 2024-01-14 MED ORDER — SEMAGLUTIDE-WEIGHT MANAGEMENT 1 MG/0.5ML ~~LOC~~ SOAJ: 1.0000 mg | SUBCUTANEOUS | 0 refills | Status: AC

## 2024-01-14 NOTE — Telephone Encounter (Signed)
 Additional information form faxed this afternoon at 3:55pm.

## 2024-01-14 NOTE — Telephone Encounter (Signed)
 Pharmacy Patient Advocate Encounter   Received notification from CoverMyMeds that prior authorization for Wegovy  0.25MG /0.5ML auto-injectors is required/requested.   Insurance verification completed.   The patient is insured through [facility] .   Per test claim: PA required; PA submitted to above mentioned insurance via CoverMyMeds Key/confirmation #/EOC [id number] Status is pending

## 2024-01-14 NOTE — Assessment & Plan Note (Signed)
 Restarting Wegovy .

## 2024-01-14 NOTE — Progress Notes (Signed)
 Subjective:  Patient ID: William Roman, male    DOB: [birth year]  Age: 53 y.o. MRN: [id number]  CC:  Follow up  HPI:  53 year old male with hypertension, A-fib, GERD, rheumatoid arthritis, CKD stage II, gout, anxiety depression, obesity, hyperlipidemia presents for follow-up.  Patient reports that overall he is doing well.  Has surgery in the near future for trigger finger, carpal tunnel.  BP mildly elevated here today.  He is compliant with his medication.  Follows with cardiology.  Patient is not taking Crestor  regarding hyperlipidemia.  Patient concerned about side effect profile from statin.  Will discuss other treatment options today.  Patient states that he is no longer on Wegovy .  There appears to have been an insurance issue.  He would like to restart this.  It helped significantly with his cravings/appetite.   Patient Active Problem List   Diagnosis Date Noted   Prediabetes 01/14/2024   Obesity (BMI 30.0-34.9) 07/17/2023   A-fib (HCC) 07/17/2023   RA (rheumatoid arthritis) (HCC) 07/16/2022   Health care maintenance 07/16/2022   CKD (chronic kidney disease) stage 2, GFR 60-89 ml/min 01/13/2022   Mixed hyperlipidemia 01/13/2022   Gout 09/04/2021   GERD (gastroesophageal reflux disease) 09/04/2021   Anxiety and depression 09/04/2021   Essential hypertension, benign 08/15/2013    Social Hx   Social History   Socioeconomic History   Marital status: Single    Spouse name: Not on file   Number of children: Not on file   Years of education: Not on file   Highest education level: Not on file  Occupational History   Not on file  Tobacco Use   Smoking status: Never   Smokeless tobacco: Never  Vaping Use   Vaping status: Never Used  Substance and Sexual Activity   Alcohol use: No   Drug use: No   Sexual activity: Not on file  Other Topics Concern   Not on file  Social History Narrative   Not on file   Social Drivers of Health   Financial Resource  Strain: Not on file  Food Insecurity: Not on file  Transportation Needs: Not on file  Physical Activity: Not on file  Stress: Not on file  Social Connections: Not on file    Review of Systems Per HPI  Objective:  BP (!) 148/92   Pulse 63   Wt 231 lb 9.6 oz (105.1 kg)   SpO2 96%   BMI 35.21 kg/m      01/14/2024    9:14 AM 01/14/2024    8:44 AM 07/17/2023    9:04 AM  BP/Weight  Systolic BP 148 150 144  Diastolic BP 92 92 80  Wt. (Lbs)  231.6   BMI  35.21 kg/m2     Physical Exam Vitals and nursing note reviewed.  Constitutional:      General: He is not in acute distress.    Appearance: Normal appearance. He is obese.  HENT:     Head: Normocephalic and atraumatic.  Eyes:     General:        Right eye: No discharge.        Left eye: No discharge.     Conjunctiva/sclera: Conjunctivae normal.  Cardiovascular:     Rate and Rhythm: Normal rate and regular rhythm.  Pulmonary:     Effort: Pulmonary effort is normal.     Breath sounds: Normal breath sounds. No wheezing, rhonchi or rales.  Neurological:     Mental Status: He is alert.  Psychiatric:        Mood and Affect: Mood normal.        Behavior: Behavior normal.     Lab Results  Component Value Date   WBC 7.7 05/09/2023   HGB 16.4 05/09/2023   HCT 45.2 05/09/2023   PLT 227 05/09/2023   GLUCOSE 82 05/09/2023   CHOL 234 (H) 07/17/2023   TRIG 352 (H) 07/17/2023   HDL 29 (L) 07/17/2023   LDLCALC 140 (H) 07/17/2023   ALT 65 (H) 07/16/2022   AST 36 07/16/2022   NA 140 05/09/2023   K 3.6 05/09/2023   CL 105 05/09/2023   CREATININE 1.26 (H) 05/09/2023   BUN 17 05/09/2023   CO2 24 05/09/2023   HGBA1C 5.9 (H) 07/17/2023     Assessment & Plan:  Obesity (BMI 30.0-34.9) Assessment & Plan: Restarting Wegovy .  Orders: -     Semaglutide -Weight Management; Inject 0.25 mg into the skin once a week for 28 days.  Dispense: 2 mL; Refill: 0 -     Semaglutide -Weight Management; Inject 0.5 mg into the skin once a  week for 28 days.  Dispense: 2 mL; Refill: 0 -     Semaglutide -Weight Management; Inject 1 mg into the skin once a week for 28 days.  Dispense: 2 mL; Refill: 0 -     Semaglutide -Weight Management; Inject 1.7 mg into the skin once a week for 28 days.  Dispense: 3 mL; Refill: 0 -     Semaglutide -Weight Management; Inject 2.4 mg into the skin once a week for 28 days.  Dispense: 3 mL; Refill: 3  Mixed hyperlipidemia Assessment & Plan: Uncontrolled.  Discussed treatment options.  Zetia is not a good option as he needs a significant reduction lipids.  Nexletol not a good option as he has gout.  Discussed statins.  Discussed Repatha.  Will reach out to my pharmacist to see if we can get Repatha approved.   Essential hypertension, benign Assessment & Plan: BP mildly elevated here today.  Continue Norvasc , triamterene /HCTZ, and metoprolol .  Continue follow-up with cardiology.  Would benefit from weight reduction.     Follow-up: 6 months  Nyan Dufresne Debrah Fan DO [facility]

## 2024-01-14 NOTE — Assessment & Plan Note (Signed)
 BP mildly elevated here today.  Continue Norvasc , triamterene /HCTZ, and metoprolol .  Continue follow-up with cardiology.  Would benefit from weight reduction.

## 2024-01-14 NOTE — Telephone Encounter (Signed)
 Discussed with PCP Will not submit for Repatha yet; patient hasn't tried/failed statin  May explore later  Zetia likely not effective given reduction needed Patient has gout; not the best candidate for Nexletol Will try statin first

## 2024-01-14 NOTE — Assessment & Plan Note (Signed)
 Uncontrolled.  Discussed treatment options.  Zetia is not a good option as he needs a significant reduction lipids.  Nexletol not a good option as he has gout.  Discussed statins.  Discussed Repatha.  Will reach out to my pharmacist to see if we can get Repatha approved.

## 2024-01-14 NOTE — Patient Instructions (Signed)
 Monitor BP.  Wegovy  sent in.  Will see about Repatha.  Colonoscopy in 2027.  Follow up in 6 months.

## 2024-01-15 ENCOUNTER — Other Ambulatory Visit (HOSPITAL_COMMUNITY): Payer: Self-pay

## 2024-01-18 ENCOUNTER — Encounter: Payer: Self-pay | Admitting: *Deleted

## 2024-01-18 ENCOUNTER — Other Ambulatory Visit (HOSPITAL_COMMUNITY): Payer: Self-pay

## 2024-01-18 ENCOUNTER — Telehealth: Payer: Self-pay | Admitting: Pharmacist

## 2024-01-18 NOTE — Telephone Encounter (Signed)
 Patient notified via mychart

## 2024-01-18 NOTE — Telephone Encounter (Signed)
 Pharmacy Patient Advocate Encounter  Received notification from [facility] that Prior Authorization for Wegovy  0.25MG /0.5ML auto-injectors has been DENIED.  Full denial letter will be uploaded to the media tab. See denial reason below.   PA #/Case ID/Reference #: Key: [id number]  An appeal will be filed with [employer] due to patient restarting therapy and not a continuation of therapy.

## 2024-01-18 NOTE — Telephone Encounter (Signed)
 William Sams, DO     Please inform patient.

## 2024-01-18 NOTE — Telephone Encounter (Signed)
 [employer] has approved the appeal for Wegovy , effective dates are 01/14/2024 - 07/12/2024.  Thank you, Dene Fines, PharmD Clinical Pharmacist  [place]  Direct Dial: [phone]

## 2024-01-18 NOTE — Telephone Encounter (Signed)
 Appeal has been submitted for Wegovy . Will advise when response is received, please be advised that most companies may take 30 days to make a decision. Appeal letter and supporting documentation have been faxed to [phone] on 01/18/2024 @11 :42 am.  Thank you, Dene Fines, PharmD Clinical Pharmacist  [place]  Direct Dial: [phone]

## 2024-02-02 ENCOUNTER — Other Ambulatory Visit: Payer: Self-pay | Admitting: Family Medicine

## 2024-02-05 DIAGNOSIS — G5602 Carpal tunnel syndrome, left upper limb: Secondary | ICD-10-CM | POA: Diagnosis not present

## 2024-02-05 DIAGNOSIS — M65312 Trigger thumb, left thumb: Secondary | ICD-10-CM | POA: Diagnosis not present

## 2024-02-05 DIAGNOSIS — G5622 Lesion of ulnar nerve, left upper limb: Secondary | ICD-10-CM | POA: Diagnosis not present

## 2024-02-11 DIAGNOSIS — L821 Other seborrheic keratosis: Secondary | ICD-10-CM | POA: Diagnosis not present

## 2024-02-11 DIAGNOSIS — M25522 Pain in left elbow: Secondary | ICD-10-CM | POA: Diagnosis not present

## 2024-02-11 DIAGNOSIS — D239 Other benign neoplasm of skin, unspecified: Secondary | ICD-10-CM | POA: Diagnosis not present

## 2024-02-11 DIAGNOSIS — L918 Other hypertrophic disorders of the skin: Secondary | ICD-10-CM | POA: Diagnosis not present

## 2024-02-13 ENCOUNTER — Other Ambulatory Visit: Payer: Self-pay | Admitting: Family Medicine

## 2024-02-13 DIAGNOSIS — I10 Essential (primary) hypertension: Secondary | ICD-10-CM

## 2024-02-17 DIAGNOSIS — M25522 Pain in left elbow: Secondary | ICD-10-CM | POA: Diagnosis not present

## 2024-02-25 DIAGNOSIS — M25522 Pain in left elbow: Secondary | ICD-10-CM | POA: Diagnosis not present

## 2024-03-10 DIAGNOSIS — M65311 Trigger thumb, right thumb: Secondary | ICD-10-CM | POA: Diagnosis not present

## 2024-04-17 ENCOUNTER — Ambulatory Visit
Admission: EM | Admit: 2024-04-17 | Discharge: 2024-04-17 | Disposition: A | Discharge status: Home / Self Care | Attending: Family Medicine | Admitting: Family Medicine

## 2024-04-17 DIAGNOSIS — H65192 Other acute nonsuppurative otitis media, left ear: Secondary | ICD-10-CM

## 2024-04-17 MED ORDER — AMOXICILLIN-POT CLAVULANATE 875-125 MG PO TABS: 1.0000 | ORAL_TABLET | Freq: Two times a day (BID) | ORAL | 0 refills | Status: DC

## 2024-04-17 NOTE — ED Triage Notes (Signed)
 Pt reports started having pain in the left sided jaw x 2 weeks; left era pain x 1 weeks. Tylenol  and decongestant gives no relief.

## 2024-04-17 NOTE — ED Provider Notes (Signed)
 [facility]  Note:  This document was prepared using Conservation officer, historic buildings and may include unintentional dictation errors.  MRN: [id number] DOB: [birth year]  Subjective:   William Roman is a 53 y.o. male presenting for 2-week history of persistent intermittent left-sided posterior jaw pain now having 1 week history of internal left ear pain.  No fever, runny or stuffy nose, tinnitus, dizziness, facial or oral swelling, dental pain, nausea, vomiting.  No history of salivary stones.  Patient is up-to-date on immunizations to the best of his knowledge.  No history of dental issues.  Patient goes to see his dentist every 6 months.  No current facility-administered medications for this encounter.  Current Outpatient Medications:    acyclovir  (ZOVIRAX ) 400 MG tablet, Take 1 tablet (400 mg total) by mouth 3 (three) times daily. For 1 week, Disp: 21 tablet, Rfl: 5   allopurinol  (ZYLOPRIM ) 300 MG tablet, TAKE 1 TABLET EVERY DAY, Disp: 90 tablet, Rfl: 3   amLODipine  (NORVASC ) 2.5 MG tablet, Take 1 tablet (2.5 mg total) by mouth 2 (two) times daily., Disp: 180 tablet, Rfl: 3   loratadine (CLARITIN) 10 MG tablet, Take 10 mg by mouth daily., Disp: , Rfl:    metoprolol  succinate (TOPROL -XL) 50 MG 24 hr tablet, Take 1.5 tablets (75 mg total) by mouth 2 (two) times daily. Take with or immediately following a meal., Disp: 270 tablet, Rfl: 3   Multiple Vitamins-Minerals (MULTIVITAMIN WITH MINERALS) tablet, Take 1 tablet by mouth daily., Disp: , Rfl:    nitroGLYCERIN  (NITROSTAT ) 0.4 MG SL tablet, Place 1 tablet (0.4 mg total) under the tongue every 5 (five) minutes as needed for chest pain., Disp: 25 tablet, Rfl: 3   pantoprazole  (PROTONIX ) 40 MG tablet, TAKE 1 TABLET EVERY DAY, Disp: 90 tablet, Rfl: 3   Semaglutide -Weight Management 1.7 MG/0.75ML SOAJ, Inject 1.7 mg into the skin once a week for 28 days., Disp: 3 mL, Rfl: 0   [START ON 05/09/2024] Semaglutide -Weight  Management 2.4 MG/0.75ML SOAJ, Inject 2.4 mg into the skin once a week for 28 days., Disp: 3 mL, Rfl: 3   triamterene -hydrochlorothiazide (DYAZIDE) 37.5-25 MG capsule, TAKE 1 CAPSULE EVERY DAY, Disp: 90 capsule, Rfl: 1   venlafaxine  XR (EFFEXOR -XR) 75 MG 24 hr capsule, TAKE 1 CAPSULE EVERY DAY WITH BREAKFAST, Disp: 90 capsule, Rfl: 3  Facility-Administered Medications Ordered in Other Encounters:    regadenoson  (LEXISCAN ) injection SOLN 0.4 mg, 0.4 mg, Intravenous, Once, Nishan, Peter C, MD   technetium tetrofosmin  (TC-MYOVIEW ) injection 32.8 millicurie, 32.8 millicurie, Intravenous, Once PRN, Nishan, Peter C, MD   Allergies  Allergen Reactions   Lisinopril Cough   Losartan  Other (See Comments)    ED   Wellbutrin [Bupropion]     Elevates emotion    Past Medical History:  Diagnosis Date   Allergic rhinitis    Allergy    seasonal   Anemia    as a child   Anxiety    Arthritis    hands,knees   Atrial fibrillation (HCC)    x3   Depression    Elevated glucose    GERD (gastroesophageal reflux disease)    Hyperlipidemia    Hypertension    Reflux    Stress      Past Surgical History:  Procedure Laterality Date   HERNIA REPAIR     TRIGGER FINGER RELEASE Right    gagn. cyst removed    Family History  Problem Relation Age of Onset   Diabetes Father  Colon cancer Neg Hx    Colon polyps Neg Hx    Crohn's disease Neg Hx    Esophageal cancer Neg Hx    Rectal cancer Neg Hx    Stomach cancer Neg Hx    Ulcerative colitis Neg Hx     Social History   Tobacco Use   Smoking status: Never   Smokeless tobacco: Never  Vaping Use   Vaping status: Never Used  Substance Use Topics   Alcohol use: No   Drug use: No    ROS   Objective:   Vitals: BP 134/84 (BP Location: Right Arm)   Pulse 74   Temp 98.2 F (36.8 C) (Oral)   Resp 16   SpO2 95%   Physical Exam Constitutional:      General: He is not in acute distress.    Appearance: Normal appearance. He is  well-developed and normal weight. He is not ill-appearing, toxic-appearing or diaphoretic.  HENT:     Head: Normocephalic and atraumatic.      Right Ear: Ear canal and external ear normal. A middle ear effusion is present. There is no impacted cerumen. Tympanic membrane is erythematous. Tympanic membrane is not bulging.     Left Ear: Ear canal and external ear normal. A middle ear effusion is present. There is no impacted cerumen. Tympanic membrane is not erythematous or bulging.     Nose: Nose normal.     Mouth/Throat:     Lips: No lesions.     Mouth: No injury, lacerations, oral lesions or angioedema.     Dentition: Normal dentition. Does not have dentures. No dental tenderness, gingival swelling, dental caries, dental abscesses or gum lesions.     Tongue: No lesions. Tongue does not deviate from midline.     Pharynx: Oropharynx is clear. No pharyngeal swelling, oropharyngeal exudate, posterior oropharyngeal erythema, uvula swelling or postnasal drip.     Tonsils: No tonsillar exudate or tonsillar abscesses.  Eyes:     General: No scleral icterus.       Right eye: No discharge.        Left eye: No discharge.     Extraocular Movements: Extraocular movements intact.  Cardiovascular:     Rate and Rhythm: Normal rate.  Pulmonary:     Effort: Pulmonary effort is normal.  Musculoskeletal:     Cervical back: Normal range of motion.  Neurological:     Mental Status: He is alert and oriented to person, place, and time.  Psychiatric:        Mood and Affect: Mood normal.        Behavior: Behavior normal.        Thought Content: Thought content normal.        Judgment: Judgment normal.     Assessment and Plan :   PDMP not reviewed this encounter.  1. Other non-recurrent acute nonsuppurative otitis media of left ear    Given his ENT exam, the only obvious sign of an issue is involving both ears.  Recommended covering for otitis media of the left ear with Augmentin .  Low suspicion for  salivary stone, mumps, no appreciable lymphadenopathy or abscess, facial cellulitis, dental infection.  Recommend monitoring for developing signs and symptoms.  Use supportive care otherwise.  Counseled patient on potential for adverse effects with medications prescribed/recommended today, ER and return-to-clinic precautions discussed, patient verbalized understanding.    Christopher Savannah, [place] 04/17/24 [phone]

## 2024-04-24 ENCOUNTER — Ambulatory Visit
Admission: EM | Admit: 2024-04-24 | Discharge: 2024-04-24 | Disposition: A | Discharge status: Home / Self Care | Attending: Emergency Medicine | Admitting: Emergency Medicine

## 2024-04-24 ENCOUNTER — Other Ambulatory Visit: Payer: Self-pay

## 2024-04-24 DIAGNOSIS — H9202 Otalgia, left ear: Secondary | ICD-10-CM | POA: Diagnosis not present

## 2024-04-24 MED ORDER — CIPROFLOXACIN-DEXAMETHASONE 0.3-0.1 % OT SUSP: 4.0000 [drp] | Freq: Two times a day (BID) | OTIC | 0 refills | Status: AC

## 2024-04-24 NOTE — Discharge Instructions (Signed)
 CiproDex  -- ear drops. 3-4 drops in the left ear, twice daily, for 5 days  Please call ENT to make a follow up appointment  Continue daily claritin, and try nasal spray like flonase

## 2024-04-24 NOTE — ED Provider Notes (Signed)
 [facility]    CSN: [id number] Arrival date & time: 04/24/24  9195      History   Chief Complaint Chief Complaint  Patient presents with   Otalgia    HPI William Roman is a 53 y.o. male.  Returning for ear pain Was seen 1 week ago on 8/3, found to have middle ear infections bilat. Was given augmentin  BID x 10 days He has completed 6 full days of the medicine and still having pain. Currently rating 3/10. It comes intermittently, can be up to 8/10 No muffled hearing No fevers No dental pain.  No facial swelling.  Does have a history of TMJ  Uses q-tips frequently   Past Medical History:  Diagnosis Date   Allergic rhinitis    Allergy    seasonal   Anemia    as a child   Anxiety    Arthritis    hands,knees   Atrial fibrillation (HCC)    x3   Depression    Elevated glucose    GERD (gastroesophageal reflux disease)    Hyperlipidemia    Hypertension    Reflux    Stress     Patient Active Problem List   Diagnosis Date Noted   Prediabetes 01/14/2024   Obesity (BMI 30.0-34.9) 07/17/2023   A-fib (HCC) 07/17/2023   RA (rheumatoid arthritis) (HCC) 07/16/2022   Health care maintenance 07/16/2022   CKD (chronic kidney disease) stage 2, GFR 60-89 ml/min 01/13/2022   Mixed hyperlipidemia 01/13/2022   Gout 09/04/2021   GERD (gastroesophageal reflux disease) 09/04/2021   Anxiety and depression 09/04/2021   Essential hypertension, benign 08/15/2013    Past Surgical History:  Procedure Laterality Date   CARPAL TUNNEL RELEASE     ELBOW SURGERY     HERNIA REPAIR     TRIGGER FINGER RELEASE Right    gagn. cyst removed       Home Medications    Prior to Admission medications   Medication Sig Start Date End Date Taking? Authorizing Provider  ciprofloxacin -dexamethasone  (CIPRODEX ) OTIC suspension Place 4 drops into the left ear 2 (two) times daily for 5 days. 04/24/24 04/29/24 Yes Jager Koska, Asberry, PA-C  acyclovir  (ZOVIRAX ) 400 MG tablet Take 1  tablet (400 mg total) by mouth 3 (three) times daily. For 1 week 01/14/23   Cook, Jayce G, DO  allopurinol  (ZYLOPRIM ) 300 MG tablet TAKE 1 TABLET EVERY DAY 02/02/24   Cook, Jayce G, DO  amLODipine  (NORVASC ) 2.5 MG tablet Take 1 tablet (2.5 mg total) by mouth 2 (two) times daily. 05/12/23   Lucien Orren SAILOR, PA-C  Colchicine (MITIGARE) 0.6 MG CAPS 1 capsule.    [provider]  loratadine (CLARITIN) 10 MG tablet Take 10 mg by mouth daily.    [provider]  metoprolol  succinate (TOPROL -XL) 50 MG 24 hr tablet Take 1.5 tablets (75 mg total) by mouth 2 (two) times daily. Take with or immediately following a meal. 05/12/23   Lucien Orren SAILOR, PA-C  Multiple Vitamins-Minerals (MULTIVITAMIN WITH MINERALS) tablet Take 1 tablet by mouth daily.    [provider]  nitroGLYCERIN  (NITROSTAT ) 0.4 MG SL tablet Place 1 tablet (0.4 mg total) under the tongue every 5 (five) minutes as needed for chest pain. 05/12/23   Lucien Orren SAILOR, PA-C  pantoprazole  (PROTONIX ) 40 MG tablet TAKE 1 TABLET EVERY DAY 02/02/24   Cook, Jayce G, DO  Semaglutide -Weight Management 1.7 MG/0.75ML SOAJ Inject 1.7 mg into the skin once a week for 28 days. 04/10/24 05/08/24  Cook, Jayce G, DO  Semaglutide -Weight Management 2.4 MG/0.75ML SOAJ Inject 2.4 mg into the skin once a week for 28 days. 05/09/24 06/06/24  Cook, Jayce G, DO  venlafaxine  XR (EFFEXOR -XR) 75 MG 24 hr capsule TAKE 1 CAPSULE EVERY DAY WITH BREAKFAST 02/02/24   Cook, Jayce G, DO    Family History Family History  Problem Relation Age of Onset   Diabetes Father    Colon cancer Neg Hx    Colon polyps Neg Hx    Crohn's disease Neg Hx    Esophageal cancer Neg Hx    Rectal cancer Neg Hx    Stomach cancer Neg Hx    Ulcerative colitis Neg Hx     Social History Social History   Tobacco Use   Smoking status: Never   Smokeless tobacco: Never  Vaping Use   Vaping status: Never Used  Substance Use Topics   Alcohol use: No   Drug use: No     Allergies    Lisinopril, Losartan , and Wellbutrin [bupropion]   Review of Systems Review of Systems As per HPI  Physical Exam Triage Vital Signs ED Triage Vitals  Encounter Vitals Group     BP      Girls Systolic BP Percentile      Girls Diastolic BP Percentile      Boys Systolic BP Percentile      Boys Diastolic BP Percentile      Pulse      Resp      Temp      Temp src      SpO2      Weight      Height      Head Circumference      Peak Flow      Pain Score      Pain Loc      Pain Education      Exclude from Growth Chart    No data found.  Updated Vital Signs BP (!) 153/97   Pulse 74   Temp 97.8 F (36.6 C) (Oral)   Resp 16   SpO2 94%   Visual Acuity Right Eye Distance:   Left Eye Distance:   Bilateral Distance:    Right Eye Near:   Left Eye Near:    Bilateral Near:     Physical Exam Vitals and nursing note reviewed.  Constitutional:      General: He is not in acute distress. HENT:     Head:     Jaw: There is normal jaw occlusion. No trismus, tenderness, swelling or pain on movement.     Comments: No facial swelling. No dental tenderness     Right Ear: Tympanic membrane, ear canal and external ear normal.     Left Ear: Tympanic membrane, ear canal and external ear normal.     Ears:     Comments: Bilateral TMs are pearly, bony landmarks identified. Canals are clear. No tragal tenderness, no pain with otoscope exam.    Mouth/Throat:     Mouth: Mucous membranes are moist.     Dentition: No dental tenderness or gingival swelling.     Pharynx: Oropharynx is clear.  Eyes:     Extraocular Movements: Extraocular movements intact.     Conjunctiva/sclera: Conjunctivae normal.     Pupils: Pupils are equal, round, and reactive to light.  Neck:     Comments: No LAD Cardiovascular:     Rate and Rhythm: Normal rate and regular rhythm.     Pulses: Normal  pulses.     Heart sounds: Normal heart sounds.  Pulmonary:     Effort: Pulmonary effort is normal.     Breath  sounds: Normal breath sounds.  Musculoskeletal:     Cervical back: Normal range of motion. No tenderness.  Lymphadenopathy:     Cervical: No cervical adenopathy.  Skin:    General: Skin is warm and dry.  Neurological:     Mental Status: He is alert and oriented to person, place, and time.      UC Treatments / Results  Labs (all labs ordered are listed, but only abnormal results are displayed) Labs Reviewed - No data to display  EKG   Radiology No results found.  Procedures Procedures (including critical care time)  Medications Ordered in UC Medications - No data to display  Initial Impression / Assessment and Plan / UC Course  I have reviewed the triage vital signs and the nursing notes.  Pertinent labs & imaging results that were available during my care of the patient were reviewed by me and considered in my medical decision making (see chart for details).  Unremarkable exam.  Afebrile in clinic.  No lymphadenopathy, swelling, tenderness to palpation anywhere in the head or neck. With intermittent symptoms, and a week of oral antibiotics without change, I do recommend trial of eardrops.  Discussed the antibiotic by mouth would not have treated an otitis externa.  Will try Ciprodex  twice daily for 5 days.  I have provided cone ear nose and throat information, advised call first thing in the morning to make a follow-up appointment. Again no red flags but we have discussed strict ED precautions  Final Clinical Impressions(s) / UC Diagnoses   Final diagnoses:  Acute otalgia, left     Discharge Instructions      CiproDex  -- ear drops. 3-4 drops in the left ear, twice daily, for 5 days  Please call ENT to make a follow up appointment  Continue daily claritin, and try nasal spray like flonase      ED Prescriptions     Medication Sig Dispense Auth. Provider   ciprofloxacin -dexamethasone  (CIPRODEX ) OTIC suspension Place 4 drops into the left ear 2 (two) times  daily for 5 days. 7.5 mL Briona Korpela, Asberry, PA-C      PDMP not reviewed this encounter.   Jeryl Asberry, [place] 04/24/24 [phone]

## 2024-04-24 NOTE — ED Triage Notes (Signed)
 Pt c/o left ear pain that radiates to left side of [id number]

## 2024-05-01 ENCOUNTER — Other Ambulatory Visit: Payer: Self-pay | Admitting: Physician Assistant

## 2024-07-09 DIAGNOSIS — M79641 Pain in right hand: Secondary | ICD-10-CM | POA: Diagnosis not present

## 2024-07-18 ENCOUNTER — Ambulatory Visit: Admitting: Family Medicine

## 2024-07-18 ENCOUNTER — Other Ambulatory Visit (HOSPITAL_COMMUNITY): Payer: Self-pay

## 2024-07-18 VITALS — BP 114/80 | HR 78 | Temp 98.1°F | Ht 68.0 in | Wt 221.4 lb

## 2024-07-18 DIAGNOSIS — I10 Essential (primary) hypertension: Secondary | ICD-10-CM

## 2024-07-18 DIAGNOSIS — Z125 Encounter for screening for malignant neoplasm of prostate: Secondary | ICD-10-CM | POA: Diagnosis not present

## 2024-07-18 DIAGNOSIS — E782 Mixed hyperlipidemia: Secondary | ICD-10-CM

## 2024-07-18 DIAGNOSIS — N182 Chronic kidney disease, stage 2 (mild): Secondary | ICD-10-CM | POA: Diagnosis not present

## 2024-07-18 DIAGNOSIS — M65311 Trigger thumb, right thumb: Secondary | ICD-10-CM | POA: Diagnosis not present

## 2024-07-18 DIAGNOSIS — I480 Paroxysmal atrial fibrillation: Secondary | ICD-10-CM

## 2024-07-18 DIAGNOSIS — R7303 Prediabetes: Secondary | ICD-10-CM

## 2024-07-18 DIAGNOSIS — Z23 Encounter for immunization: Secondary | ICD-10-CM

## 2024-07-18 DIAGNOSIS — F32A Depression, unspecified: Secondary | ICD-10-CM

## 2024-07-18 DIAGNOSIS — F419 Anxiety disorder, unspecified: Secondary | ICD-10-CM

## 2024-07-18 DIAGNOSIS — E66811 Obesity, class 1: Secondary | ICD-10-CM

## 2024-07-18 DIAGNOSIS — M65321 Trigger finger, right index finger: Secondary | ICD-10-CM | POA: Diagnosis not present

## 2024-07-18 MED ORDER — AMLODIPINE BESYLATE 2.5 MG PO TABS: 2.5000 mg | ORAL_TABLET | Freq: Two times a day (BID) | ORAL | 3 refills | Status: AC

## 2024-07-18 MED ORDER — METOPROLOL SUCCINATE ER 50 MG PO TB24: 75.0000 mg | ORAL_TABLET | Freq: Two times a day (BID) | ORAL | 3 refills | Status: AC

## 2024-07-18 MED ORDER — WEGOVY 0.5 MG/0.5ML ~~LOC~~ SOAJ: 0.5000 mg | SUBCUTANEOUS | 0 refills | Status: AC

## 2024-07-18 NOTE — Assessment & Plan Note (Signed)
Labs today to assess.   

## 2024-07-18 NOTE — Patient Instructions (Signed)
Labs today.  Follow up in 6 months.  Take care  Dr. Alyxis Grippi  

## 2024-07-18 NOTE — Progress Notes (Signed)
 Subjective:  Patient ID: William Roman, male    DOB: 1970-09-27  Age: 53 y.o. MRN: [id number]  CC:   Chief Complaint  Patient presents with   Medication Refill    Patient is here for medication refills. Patient mentioned having 3 surgeries done after his last visit here. (Carpal tunnel, thumb, and elbow)    HPI:  53 year old male presents for follow-up.  BP is well-controlled on amlodipine  and metoprolol .  Needs refills.  Patient reports that yesterday he felt like he was back in A-fib.  He states that his heart rate was elevated and then returned back to his normal range.  Will assess today.  Patient is amenable to flu vaccine and tetanus vaccine today.  Colonoscopy up-to-date.  No recent gout flares.  Compliant with allopurinol .  Mood stable on Effexor .  Patient Active Problem List   Diagnosis Date Noted   Prediabetes 01/14/2024   Obesity (BMI 30.0-34.9) 07/17/2023   A-fib (HCC) 07/17/2023   RA (rheumatoid arthritis) (HCC) 07/16/2022   Health care maintenance 07/16/2022   CKD (chronic kidney disease) stage 2, GFR 60-89 ml/min 01/13/2022   Mixed hyperlipidemia 01/13/2022   Gout 09/04/2021   GERD (gastroesophageal reflux disease) 09/04/2021   Anxiety and depression 09/04/2021   Essential hypertension, benign 08/15/2013    Social Hx   Social History   Socioeconomic History   Marital status: Single    Spouse name: Not on file   Number of children: Not on file   Years of education: Not on file   Highest education level: 12th grade  Occupational History   Not on file  Tobacco Use   Smoking status: Never   Smokeless tobacco: Never  Vaping Use   Vaping status: Never Used  Substance and Sexual Activity   Alcohol use: No   Drug use: No   Sexual activity: Not Currently  Other Topics Concern   Not on file  Social History Narrative   Not on file   Social Drivers of Health   Financial Resource Strain: Low Risk  (07/17/2024)   Overall Financial Resource  Strain (CARDIA)    Difficulty of Paying Living Expenses: Not hard at all  Food Insecurity: No Food Insecurity (07/17/2024)   Hunger Vital Sign    Worried About Running Out of Food in the Last Year: Never true    Ran Out of Food in the Last Year: Never true  Transportation Needs: No Transportation Needs (07/17/2024)   PRAPARE - Administrator, Civil Service (Medical): No    Lack of Transportation (Non-Medical): No  Physical Activity: Not on file  Stress: Not on file  Social Connections: Socially Isolated (07/17/2024)   Social Connection and Isolation Panel    Frequency of Communication with Friends and Family: Once a week    Frequency of Social Gatherings with Friends and Family: Once a week    Attends Religious Services: More than 4 times per year    Active Member of [employer] or Organizations: No    Attends Engineer, Structural: Not on file    Marital Status: Divorced    Review of Systems Per HPI  Objective:  BP 114/80 (BP Location: Left Arm, Patient Position: Sitting)   Pulse 78   Temp 98.1 F (36.7 C)   Ht 5' 8 (1.727 m)   Wt 221 lb 6 oz (100.4 kg)   BMI 33.66 kg/m      07/18/2024    8:43 AM 04/24/2024    8:15 AM  04/17/2024    8:32 AM  BP/Weight  Systolic BP 114 153 134  Diastolic BP 80 97 84  Wt. (Lbs) 221.38    BMI 33.66 kg/m2      Physical Exam Constitutional:      General: He is not in acute distress.    Appearance: Normal appearance.  HENT:     Head: Normocephalic and atraumatic.  Cardiovascular:     Comments: Irregularly irregular. Pulmonary:     Effort: Pulmonary effort is normal.     Breath sounds: Normal breath sounds. No wheezing, rhonchi or rales.  Neurological:     Mental Status: He is alert.     Lab Results  Component Value Date   WBC 7.7 05/09/2023   HGB 16.4 05/09/2023   HCT 45.2 05/09/2023   PLT 227 05/09/2023   GLUCOSE 82 05/09/2023   CHOL 234 (H) 07/17/2023   TRIG 352 (H) 07/17/2023   HDL 29 (L) 07/17/2023    LDLCALC 140 (H) 07/17/2023   ALT 65 (H) 07/16/2022   AST 36 07/16/2022   NA 140 05/09/2023   K 3.6 05/09/2023   CL 105 05/09/2023   CREATININE 1.26 (H) 05/09/2023   BUN 17 05/09/2023   CO2 24 05/09/2023   HGBA1C 5.9 (H) 07/17/2023     Assessment & Plan:  Essential hypertension, benign Assessment & Plan: Stable.  Continue current medications.  Orders: -     amLODIPine  Besylate; Take 1 tablet (2.5 mg total) by mouth 2 (two) times daily.  Dispense: 180 tablet; Refill: 3  Immunization due -     Flu vaccine trivalent PF, 6mos and older(Flulaval,Afluria,Fluarix,Fluzone) -     Tdap vaccine greater than or equal to 7yo IM  Mixed hyperlipidemia Assessment & Plan: Labs today to assess.  Orders: -     Lipid panel  CKD (chronic kidney disease) stage 2, GFR 60-89 ml/min -     CBC -     CMP14+EGFR -     Microalbumin / creatinine urine ratio  Prediabetes -     Hemoglobin A1c  Screening PSA (prostate specific antigen) -     PSA  Paroxysmal atrial fibrillation (HCC) Assessment & Plan: Patient in A-fib today.  CHA2DS2-VASc score of 1.  Continue metoprolol .  Asymptomatic currently.  Orders: -     Metoprolol  Succinate ER; Take 1.5 tablets (75 mg total) by mouth 2 (two) times daily. Take with or immediately following a meal.  Dispense: 270 tablet; Refill: 3  Obesity (BMI 30.0-34.9) Assessment & Plan: Rx sent in for Wegovy .  Orders: -     Wegovy ; Inject 0.5 mg into the skin once a week.  Dispense: 2 mL; Refill: 0  Anxiety and depression Assessment & Plan: Stable on Effexor .  Continue.     Follow-up:  6 months  Jaeli Grubb Bluford DO [facility]

## 2024-07-18 NOTE — Assessment & Plan Note (Signed)
 Stable.  Continue current medications.

## 2024-07-18 NOTE — Assessment & Plan Note (Signed)
 Rx sent in for Wegovy .

## 2024-07-18 NOTE — Assessment & Plan Note (Signed)
Stable on Effexor. Continue.

## 2024-07-18 NOTE — Assessment & Plan Note (Signed)
 Patient in A-fib today.  CHA2DS2-VASc score of 1.  Continue metoprolol .  Asymptomatic currently.

## 2024-07-19 ENCOUNTER — Encounter: Payer: Self-pay | Admitting: Pharmacy Technician

## 2024-07-19 ENCOUNTER — Telehealth: Payer: Self-pay | Admitting: Pharmacy Technician

## 2024-07-19 LAB — CMP14+EGFR
ALT: 36 IU/L (ref 0–44)
AST: 26 IU/L (ref 0–40)
Albumin: 4.5 g/dL (ref 4.1–5.1)
Alkaline Phosphatase: 76 IU/L (ref 47–123)
BUN/Creatinine Ratio: 16 (ref 9–20)
BUN: 21 mg/dL (ref 6–24)
Bilirubin Total: 0.5 mg/dL (ref 0.0–1.2)
CO2: 21 mmol/L (ref 20–29)
Calcium: 9.5 mg/dL (ref 8.7–10.2)
Chloride: 106 mmol/L (ref 96–106)
Creatinine, Ser: 1.3 mg/dL — ABNORMAL HIGH (ref 0.76–1.27)
Globulin, Total: 2 g/dL (ref 1.5–4.5)
Glucose: 106 mg/dL — ABNORMAL HIGH (ref 70–99)
Potassium: 4 mmol/L (ref 3.5–5.2)
Sodium: 141 mmol/L (ref 134–144)
Total Protein: 6.5 g/dL (ref 6.0–8.5)
eGFR: 67 mL/min/1.73 (ref >59)

## 2024-07-19 LAB — CBC
Hematocrit: 50.3 % (ref 37.5–51.0)
Hemoglobin: 16.9 g/dL (ref 13.0–17.7)
MCH: 30.9 pg (ref 26.6–33.0)
MCHC: 33.6 g/dL (ref 31.5–35.7)
MCV: 92 fL (ref 79–97)
Platelets: 242 x10E3/uL (ref 150–450)
RBC: 5.47 x10E6/uL (ref 4.14–5.80)
RDW: 13.3 % (ref 11.6–15.4)
WBC: 7.4 x10E3/uL (ref 3.4–10.8)

## 2024-07-19 LAB — MICROALBUMIN / CREATININE URINE RATIO
Creatinine, Urine: 166.2 mg/dL
Microalb/Creat Ratio: 8 mg/g{creat} (ref 0–29)
Microalbumin, Urine: 14.1 ug/mL

## 2024-07-19 LAB — LIPID PANEL
Chol/HDL Ratio: 7 ratio — ABNORMAL HIGH (ref 0.0–5.0)
Cholesterol, Total: 217 mg/dL — ABNORMAL HIGH (ref 100–199)
HDL: 31 mg/dL — ABNORMAL LOW (ref >39)
LDL Chol Calc (NIH): 146 mg/dL — ABNORMAL HIGH (ref 0–99)
Triglycerides: 220 mg/dL — ABNORMAL HIGH (ref 0–149)
VLDL Cholesterol Cal: 40 mg/dL (ref 5–40)

## 2024-07-19 LAB — PSA: Prostate Specific Ag, Serum: 0.6 ng/mL (ref 0.0–4.0)

## 2024-07-19 LAB — HEMOGLOBIN A1C
Est. average glucose Bld gHb Est-mCnc: 94 mg/dL
Hgb A1c MFr Bld: 4.9 % (ref 4.8–5.6)

## 2024-07-19 NOTE — Telephone Encounter (Signed)
 Pharmacy Patient Advocate Encounter   Received notification from CoverMyMeds that prior authorization for Wegovy  0.5MG /0.5ML auto-injectors is required/requested.   Insurance verification completed.   The patient is insured through [facility].   Per test claim: PA required; PA submitted to above mentioned insurance via Latent Key/confirmation #/EOC [id number] Status is pending

## 2024-07-20 ENCOUNTER — Ambulatory Visit: Payer: Self-pay | Admitting: Family Medicine

## 2024-07-20 NOTE — Telephone Encounter (Signed)
 Pharmacy Patient Advocate Encounter  Received notification from [facility] that Prior Authorization for Wegovy  0.5MG /0.5ML auto-injectors has been DENIED.  Full denial letter will be uploaded to the media tab. See denial reason below.   PA #/Case ID/Reference #: [id number]

## 2024-07-28 ENCOUNTER — Other Ambulatory Visit (HOSPITAL_COMMUNITY): Payer: Self-pay

## 2024-08-05 NOTE — Telephone Encounter (Signed)
 Copied from CRM [id number]. Topic: Clinical - Prescription Issue >> Aug 05, 2024  2:08 PM Lonell PEDLAR wrote: Reason for CRM: Patient called regarding PA for Wegovy . Advised it was denied. Please contact pt with next steps. [phone]

## 2024-08-18 DIAGNOSIS — M65321 Trigger finger, right index finger: Secondary | ICD-10-CM | POA: Diagnosis not present

## 2024-08-18 DIAGNOSIS — M65311 Trigger thumb, right thumb: Secondary | ICD-10-CM | POA: Diagnosis not present

## 2024-08-21 ENCOUNTER — Ambulatory Visit
Admission: EM | Admit: 2024-08-21 | Discharge: 2024-08-21 | Disposition: A | Discharge status: Home / Self Care | Attending: Family Medicine | Admitting: Family Medicine

## 2024-08-21 DIAGNOSIS — R070 Pain in throat: Secondary | ICD-10-CM

## 2024-08-21 DIAGNOSIS — B370 Candidal stomatitis: Secondary | ICD-10-CM

## 2024-08-21 LAB — POCT RAPID STREP A (OFFICE): Rapid Strep A Screen: NEGATIVE

## 2024-08-21 MED ORDER — NYSTATIN 100000 UNIT/ML MT SUSP: 500000.0000 [IU] | Freq: Four times a day (QID) | OROMUCOSAL | 0 refills | Status: AC

## 2024-08-21 MED ORDER — NYSTATIN 100000 UNIT/ML MT SUSP: 500000.0000 [IU] | Freq: Four times a day (QID) | OROMUCOSAL | 0 refills | Status: DC

## 2024-08-21 NOTE — ED Triage Notes (Signed)
 Pt reports sore throat x 1 week. States he looked his throat in the mirror yesterday and was a mess the uvula looks long with white spots, white tongue. Throat lonzeges gives some relief.  Pt had trigger finger surgery right hand on 08/18/24.

## 2024-08-21 NOTE — ED Provider Notes (Signed)
 [facility]  Note:  This document was prepared using Conservation officer, historic buildings and may include unintentional dictation errors.  MRN: [id number] DOB: 06-28-1971  Subjective:   William Roman is a 53 y.o. male presenting for 1 week history of throat pain, white spots on the uvula couple of days with a white tongue.  Has felt intermittent transient right ear pain.  Has also had a slight cough.  Patient just had trigger finger surgery on 08/18/2024.  He is not on immunosuppression.  Has not taken antibiotics or steroids.  No current facility-administered medications for this encounter.  Current Outpatient Medications:    acyclovir  (ZOVIRAX ) 400 MG tablet, Take 1 tablet (400 mg total) by mouth 3 (three) times daily. For 1 week, Disp: 21 tablet, Rfl: 5   allopurinol  (ZYLOPRIM ) 300 MG tablet, TAKE 1 TABLET EVERY DAY, Disp: 90 tablet, Rfl: 3   amLODipine  (NORVASC ) 2.5 MG tablet, Take 1 tablet (2.5 mg total) by mouth 2 (two) times daily., Disp: 180 tablet, Rfl: 3   Colchicine (MITIGARE) 0.6 MG CAPS, 1 capsule., Disp: , Rfl:    loratadine (CLARITIN) 10 MG tablet, Take 10 mg by mouth daily., Disp: , Rfl:    metoprolol  succinate (TOPROL -XL) 50 MG 24 hr tablet, Take 1.5 tablets (75 mg total) by mouth 2 (two) times daily. Take with or immediately following a meal., Disp: 270 tablet, Rfl: 3   Multiple Vitamins-Minerals (MULTIVITAMIN WITH MINERALS) tablet, Take 1 tablet by mouth daily., Disp: , Rfl:    nitroGLYCERIN  (NITROSTAT ) 0.4 MG SL tablet, Place 1 tablet (0.4 mg total) under the tongue every 5 (five) minutes as needed for chest pain., Disp: 25 tablet, Rfl: 3   pantoprazole  (PROTONIX ) 40 MG tablet, TAKE 1 TABLET EVERY DAY, Disp: 90 tablet, Rfl: 3   semaglutide -weight management (WEGOVY ) 0.5 MG/0.5ML SOAJ SQ injection, Inject 0.5 mg into the skin once a week., Disp: 2 mL, Rfl: 0   venlafaxine  XR (EFFEXOR -XR) 75 MG 24 hr capsule, TAKE 1 CAPSULE EVERY DAY WITH  BREAKFAST, Disp: 90 capsule, Rfl: 3   Allergies  Allergen Reactions   Lisinopril Cough   Losartan  Other (See Comments)    ED   Wellbutrin [Bupropion]     Elevates emotion    Past Medical History:  Diagnosis Date   Allergic rhinitis    Allergy    seasonal   Anemia    as a child   Anxiety    Arthritis    hands,knees   Atrial fibrillation (HCC)    x3   Depression    Elevated glucose    GERD (gastroesophageal reflux disease)    Hyperlipidemia    Hypertension    Reflux    Stress      Past Surgical History:  Procedure Laterality Date   CARPAL TUNNEL RELEASE     ELBOW SURGERY     HERNIA REPAIR     TRIGGER FINGER RELEASE Right    gagn. cyst removed    Family History  Problem Relation Age of Onset   Diabetes Father    Colon cancer Neg Hx    Colon polyps Neg Hx    Crohn's disease Neg Hx    Esophageal cancer Neg Hx    Rectal cancer Neg Hx    Stomach cancer Neg Hx    Ulcerative colitis Neg Hx     Social History   Tobacco Use   Smoking status: Never   Smokeless tobacco: Never  Vaping Use   Vaping  status: Never Used  Substance Use Topics   Alcohol use: No   Drug use: No    ROS   Objective:   Vitals: BP (!) 148/91 (BP Location: Right Arm)   Pulse 97   Temp 98.2 F (36.8 C) (Oral)   Resp 18   SpO2 95%   Physical Exam Constitutional:      General: He is not in acute distress.    Appearance: Normal appearance. He is well-developed and normal weight. He is not ill-appearing, toxic-appearing or diaphoretic.  HENT:     Head: Normocephalic and atraumatic.     Right Ear: External ear normal.     Left Ear: External ear normal.     Nose: Nose normal.     Mouth/Throat:     Pharynx: No pharyngeal swelling, oropharyngeal exudate, posterior oropharyngeal erythema or uvula swelling.     Tonsils: No tonsillar exudate or tonsillar abscesses. 0 on the right. 0 on the left.     Comments: Diffuse white plaque over tongue with slightly erythematic uvula and white  plaques overlying uvula. Eyes:     General: No scleral icterus.       Right eye: No discharge.        Left eye: No discharge.     Extraocular Movements: Extraocular movements intact.  Cardiovascular:     Rate and Rhythm: Normal rate.  Pulmonary:     Effort: Pulmonary effort is normal.  Musculoskeletal:     Cervical back: Normal range of motion.  Neurological:     Mental Status: He is alert and oriented to person, place, and time.  Psychiatric:        Mood and Affect: Mood normal.        Behavior: Behavior normal.        Thought Content: Thought content normal.        Judgment: Judgment normal.    Results for orders placed or performed during the hospital encounter of 08/21/24 (from the past 24 hours)  POCT rapid strep A     Status: None   Collection Time: 08/21/24  9:10 AM  Result Value Ref Range   Rapid Strep A Screen Negative Negative     Assessment and Plan :   PDMP not reviewed this encounter.  1. Oral thrush   2. Throat pain    Strep culture pending, physical exam findings more consistent with oral thrush.  Recommend nystatin . Counseled patient on potential for adverse effects with medications prescribed/recommended today, ER and return-to-clinic precautions discussed, patient verbalized understanding.    Christopher Savannah, [place] 08/21/24 [phone]

## 2024-08-22 ENCOUNTER — Other Ambulatory Visit (HOSPITAL_COMMUNITY): Payer: Self-pay

## 2025-01-23 ENCOUNTER — Ambulatory Visit: Admitting: Family Medicine
